# Patient Record
Sex: Female | Born: 1960 | Race: Black or African American | Hispanic: No | Marital: Married | Smoking: Never smoker
Health system: Southern US, Community
[De-identification: ages and names within clinical notes are randomized; demographics above are authoritative.]

## PROBLEM LIST (undated history)

## (undated) DIAGNOSIS — C801 Malignant (primary) neoplasm, unspecified: Secondary | ICD-10-CM

## (undated) DIAGNOSIS — I10 Essential (primary) hypertension: Secondary | ICD-10-CM

## (undated) DIAGNOSIS — Z803 Family history of malignant neoplasm of breast: Secondary | ICD-10-CM

## (undated) DIAGNOSIS — E119 Type 2 diabetes mellitus without complications: Secondary | ICD-10-CM

## (undated) HISTORY — DX: Malignant (primary) neoplasm, unspecified: C80.1

## (undated) HISTORY — DX: Family history of malignant neoplasm of breast: Z80.3

## (undated) HISTORY — DX: Essential (primary) hypertension: I10

## (undated) HISTORY — DX: Type 2 diabetes mellitus without complications: E11.9

---

## 1994-07-30 HISTORY — PX: OTHER SURGICAL HISTORY: SHX169

## 2009-12-22 ENCOUNTER — Ambulatory Visit: Payer: Self-pay | Admitting: Unknown Physician Specialty

## 2013-02-02 ENCOUNTER — Emergency Department: Payer: Self-pay | Admitting: Emergency Medicine

## 2013-02-19 ENCOUNTER — Ambulatory Visit: Payer: Self-pay | Admitting: Unknown Physician Specialty

## 2013-02-20 ENCOUNTER — Ambulatory Visit: Payer: Self-pay | Admitting: Internal Medicine

## 2013-02-20 LAB — CBC CANCER CENTER
Basophil #: 0 x10 3/mm (ref 0.0–0.1)
Basophil %: 0.5 %
Eosinophil #: 0.1 x10 3/mm (ref 0.0–0.7)
Eosinophil %: 1.4 %
HCT: 34.1 % — ABNORMAL LOW (ref 35.0–47.0)
Lymphocyte %: 28.9 %
MCH: 33.4 pg (ref 26.0–34.0)
Monocyte %: 5.7 %
Neutrophil #: 3.8 x10 3/mm (ref 1.4–6.5)
Neutrophil %: 63.5 %
RDW: 13.5 % (ref 11.5–14.5)
WBC: 5.9 x10 3/mm (ref 3.6–11.0)

## 2013-02-20 LAB — COMPREHENSIVE METABOLIC PANEL
Albumin: 3.3 g/dL — ABNORMAL LOW (ref 3.4–5.0)
Anion Gap: 2 — ABNORMAL LOW (ref 7–16)
Bilirubin,Total: 0.3 mg/dL (ref 0.2–1.0)
Calcium, Total: 9 mg/dL (ref 8.5–10.1)
Co2: 32 mmol/L (ref 21–32)
Glucose: 147 mg/dL — ABNORMAL HIGH (ref 65–99)
Osmolality: 278 (ref 275–301)
Potassium: 4.4 mmol/L (ref 3.5–5.1)
SGPT (ALT): 35 U/L (ref 12–78)
Sodium: 137 mmol/L (ref 136–145)

## 2013-02-24 LAB — PROT IMMUNOELECTROPHORES(ARMC)

## 2013-02-24 LAB — KAPPA/LAMBDA FREE LIGHT CHAINS (ARMC)

## 2013-02-27 ENCOUNTER — Ambulatory Visit: Payer: Self-pay | Admitting: Internal Medicine

## 2013-03-18 LAB — CBC CANCER CENTER
Eosinophil: 1 %
HGB: 12.2 g/dL (ref 12.0–16.0)
Lymphocytes: 39 %
MCH: 32.8 pg (ref 26.0–34.0)
MCHC: 35 g/dL (ref 32.0–36.0)
MCV: 94 fL (ref 80–100)
Monocytes: 6 %
Other Cells Blood: 1 %
Platelet: 308 x10 3/mm (ref 150–440)
RBC: 3.7 10*6/uL — ABNORMAL LOW (ref 3.80–5.20)
WBC: 5.7 x10 3/mm (ref 3.6–11.0)

## 2013-03-18 LAB — RETICULOCYTES: Reticulocyte: 2.81 %

## 2013-03-30 ENCOUNTER — Ambulatory Visit: Payer: Self-pay | Admitting: Internal Medicine

## 2013-04-09 LAB — CBC CANCER CENTER
Basophil #: 0 x10 3/mm (ref 0.0–0.1)
Basophil %: 0.5 %
Eosinophil #: 0.2 x10 3/mm (ref 0.0–0.7)
Eosinophil %: 2.7 %
HGB: 12.2 g/dL (ref 12.0–16.0)
Lymphocyte #: 3.2 x10 3/mm (ref 1.0–3.6)
Lymphocyte %: 43.2 %
MCHC: 34.2 g/dL (ref 32.0–36.0)
MCV: 95 fL (ref 80–100)
Monocyte #: 0.5 x10 3/mm (ref 0.2–0.9)
Monocyte %: 6.2 %
Neutrophil %: 47.4 %
Platelet: 371 x10 3/mm (ref 150–440)
RBC: 3.76 10*6/uL — ABNORMAL LOW (ref 3.80–5.20)
WBC: 7.5 x10 3/mm (ref 3.6–11.0)

## 2013-04-09 LAB — CALCIUM: Calcium, Total: 10.2 mg/dL — ABNORMAL HIGH (ref 8.5–10.1)

## 2013-04-09 LAB — CREATININE, SERUM: EGFR (Non-African Amer.): 54 — ABNORMAL LOW

## 2013-04-09 LAB — URIC ACID: Uric Acid: 5.2 mg/dL (ref 2.6–6.0)

## 2013-04-13 LAB — PROT IMMUNOELECTROPHORES(ARMC)

## 2013-04-15 LAB — CREATININE, SERUM
Creatinine: 1.13 mg/dL (ref 0.60–1.30)
EGFR (Non-African Amer.): 56 — ABNORMAL LOW

## 2013-04-15 LAB — CALCIUM: Calcium, Total: 9.8 mg/dL (ref 8.5–10.1)

## 2013-04-20 LAB — CBC CANCER CENTER
Basophil #: 0.1 x10 3/mm (ref 0.0–0.1)
Eosinophil %: 2.2 %
Lymphocyte %: 36.2 %
MCH: 32.5 pg (ref 26.0–34.0)
MCHC: 34.4 g/dL (ref 32.0–36.0)
MCV: 95 fL (ref 80–100)
Monocyte %: 7.1 %
Neutrophil %: 53.5 %
RDW: 13.9 % (ref 11.5–14.5)

## 2013-04-24 LAB — CBC CANCER CENTER
Basophil %: 0.5 %
Eosinophil %: 4.1 %
HCT: 34.4 % — ABNORMAL LOW (ref 35.0–47.0)
HGB: 11.8 g/dL — ABNORMAL LOW (ref 12.0–16.0)
Lymphocyte #: 1 x10 3/mm (ref 1.0–3.6)
Lymphocyte %: 19 %
MCHC: 34.4 g/dL (ref 32.0–36.0)
Monocyte #: 0.4 x10 3/mm (ref 0.2–0.9)
Monocyte %: 7.3 %
Neutrophil %: 69.1 %
RDW: 13.8 % (ref 11.5–14.5)
WBC: 5.3 x10 3/mm (ref 3.6–11.0)

## 2013-04-24 LAB — BASIC METABOLIC PANEL
Anion Gap: 8 (ref 7–16)
Chloride: 97 mmol/L — ABNORMAL LOW (ref 98–107)
Co2: 29 mmol/L (ref 21–32)
EGFR (Non-African Amer.): 59 — ABNORMAL LOW
Glucose: 148 mg/dL — ABNORMAL HIGH (ref 65–99)
Osmolality: 271 (ref 275–301)
Sodium: 134 mmol/L — ABNORMAL LOW (ref 136–145)

## 2013-04-29 ENCOUNTER — Ambulatory Visit: Payer: Self-pay | Admitting: Internal Medicine

## 2013-05-07 LAB — HCG, QUANTITATIVE, PREGNANCY: Beta Hcg, Quant.: <1 m[IU]/mL — ABNORMAL LOW

## 2013-05-18 ENCOUNTER — Observation Stay: Payer: Self-pay | Admitting: Internal Medicine

## 2013-05-18 LAB — BASIC METABOLIC PANEL
Anion Gap: 6 — ABNORMAL LOW (ref 7–16)
BUN: 19 mg/dL — ABNORMAL HIGH (ref 7–18)
Calcium, Total: 8.2 mg/dL — ABNORMAL LOW (ref 8.5–10.1)
Chloride: 96 mmol/L — ABNORMAL LOW (ref 98–107)
Creatinine: 1.75 mg/dL — ABNORMAL HIGH (ref 0.60–1.30)
EGFR (Non-African Amer.): 33 — ABNORMAL LOW
Glucose: 294 mg/dL — ABNORMAL HIGH (ref 65–99)
Osmolality: 278 (ref 275–301)
Potassium: 4.3 mmol/L (ref 3.5–5.1)

## 2013-05-18 LAB — HEPATIC FUNCTION PANEL A (ARMC)
Alkaline Phosphatase: 89 U/L (ref 50–136)
SGOT(AST): 12 U/L — ABNORMAL LOW (ref 15–37)
SGPT (ALT): 19 U/L (ref 12–78)
Total Protein: 8.6 g/dL — ABNORMAL HIGH (ref 6.4–8.2)

## 2013-05-18 LAB — URIC ACID: Uric Acid: 5.2 mg/dL (ref 2.6–6.0)

## 2013-05-18 LAB — PREGNANCY, URINE: Pregnancy Test, Urine: NEGATIVE m[IU]/mL

## 2013-05-19 LAB — BASIC METABOLIC PANEL
Anion Gap: 4 — ABNORMAL LOW (ref 7–16)
BUN: 15 mg/dL (ref 7–18)
Calcium, Total: 7.9 mg/dL — ABNORMAL LOW (ref 8.5–10.1)
Chloride: 106 mmol/L (ref 98–107)
Co2: 27 mmol/L (ref 21–32)
EGFR (African American): >60
EGFR (Non-African Amer.): >60
Glucose: 191 mg/dL — ABNORMAL HIGH (ref 65–99)
Osmolality: 280 (ref 275–301)
Potassium: 3.9 mmol/L (ref 3.5–5.1)

## 2013-05-19 LAB — CBC WITH DIFFERENTIAL/PLATELET
Basophil #: 0 10*3/uL (ref 0.0–0.1)
Basophil %: 0.3 %
Eosinophil %: 2.3 %
HCT: 31.5 % — ABNORMAL LOW (ref 35.0–47.0)
HGB: 10.9 g/dL — ABNORMAL LOW (ref 12.0–16.0)
MCHC: 34.5 g/dL (ref 32.0–36.0)
Monocyte #: 0.4 x10 3/mm (ref 0.2–0.9)
Monocyte %: 9.7 %
Neutrophil #: 3.1 10*3/uL (ref 1.4–6.5)
Neutrophil %: 69.5 %
Platelet: 274 10*3/uL (ref 150–440)
RBC: 3.32 10*6/uL — ABNORMAL LOW (ref 3.80–5.20)
WBC: 4.5 10*3/uL (ref 3.6–11.0)

## 2013-05-19 LAB — URIC ACID: Uric Acid: 3.8 mg/dL (ref 2.6–6.0)

## 2013-05-19 LAB — CREATININE, SERUM: Creatinine: 0.83 mg/dL (ref 0.60–1.30)

## 2013-05-19 LAB — POTASSIUM: Potassium: 3.5 mmol/L (ref 3.5–5.1)

## 2013-05-21 LAB — PROT IMMUNOELECTROPHORES(ARMC)

## 2013-05-25 LAB — PREGNANCY, URINE: Pregnancy Test, Urine: NEGATIVE m[IU]/mL

## 2013-05-30 ENCOUNTER — Ambulatory Visit: Payer: Self-pay | Admitting: Internal Medicine

## 2013-06-05 LAB — PREGNANCY, URINE: Pregnancy Test, Urine: NEGATIVE m[IU]/mL

## 2013-06-29 ENCOUNTER — Ambulatory Visit: Payer: Self-pay | Admitting: Internal Medicine

## 2013-07-30 ENCOUNTER — Ambulatory Visit: Payer: Self-pay | Admitting: Internal Medicine

## 2013-07-30 HISTORY — PX: LIMBAL STEM CELL TRANSPLANT: SHX1969

## 2013-08-26 DIAGNOSIS — C9000 Multiple myeloma not having achieved remission: Secondary | ICD-10-CM | POA: Insufficient documentation

## 2013-08-30 ENCOUNTER — Ambulatory Visit: Payer: Self-pay | Admitting: Internal Medicine

## 2013-09-14 ENCOUNTER — Ambulatory Visit: Payer: Self-pay | Admitting: Gastroenterology

## 2013-09-15 LAB — PATHOLOGY REPORT

## 2013-09-27 ENCOUNTER — Ambulatory Visit: Payer: Self-pay | Admitting: Internal Medicine

## 2013-10-28 ENCOUNTER — Ambulatory Visit: Payer: Self-pay | Admitting: Internal Medicine

## 2013-11-25 DIAGNOSIS — E119 Type 2 diabetes mellitus without complications: Secondary | ICD-10-CM | POA: Insufficient documentation

## 2013-11-27 ENCOUNTER — Ambulatory Visit: Payer: Self-pay | Admitting: Internal Medicine

## 2013-12-09 DIAGNOSIS — I10 Essential (primary) hypertension: Secondary | ICD-10-CM | POA: Insufficient documentation

## 2014-01-11 ENCOUNTER — Ambulatory Visit: Payer: Self-pay | Admitting: Internal Medicine

## 2014-01-11 LAB — POTASSIUM: Potassium: 4.2 mmol/L (ref 3.5–5.1)

## 2014-01-27 ENCOUNTER — Ambulatory Visit: Payer: Self-pay | Admitting: Internal Medicine

## 2014-03-02 ENCOUNTER — Ambulatory Visit: Payer: Self-pay | Admitting: Internal Medicine

## 2014-04-01 ENCOUNTER — Ambulatory Visit: Payer: Self-pay | Admitting: Internal Medicine

## 2014-04-18 DIAGNOSIS — E890 Postprocedural hypothyroidism: Secondary | ICD-10-CM | POA: Insufficient documentation

## 2014-04-19 DIAGNOSIS — J309 Allergic rhinitis, unspecified: Secondary | ICD-10-CM | POA: Insufficient documentation

## 2014-04-29 ENCOUNTER — Ambulatory Visit: Payer: Self-pay | Admitting: Internal Medicine

## 2014-05-30 ENCOUNTER — Ambulatory Visit: Payer: Self-pay | Admitting: Internal Medicine

## 2014-06-29 ENCOUNTER — Ambulatory Visit: Payer: Self-pay | Admitting: Internal Medicine

## 2014-07-30 ENCOUNTER — Ambulatory Visit: Payer: Self-pay | Admitting: Internal Medicine

## 2014-08-30 ENCOUNTER — Ambulatory Visit: Payer: Self-pay | Admitting: Internal Medicine

## 2014-09-28 ENCOUNTER — Ambulatory Visit
Admit: 2014-09-28 | Disposition: A | Discharge status: Home / Self Care | Payer: Self-pay | Attending: Internal Medicine | Admitting: Internal Medicine

## 2014-10-29 ENCOUNTER — Ambulatory Visit
Admit: 2014-10-29 | Disposition: A | Discharge status: Home / Self Care | Payer: Self-pay | Attending: Internal Medicine | Admitting: Internal Medicine

## 2014-11-19 NOTE — H&P (Signed)
PATIENT NAME:  Annette Jimenez, Annette Jimenez MR#:  [id number] DATE OF BIRTH:  1960/11/03  DATE OF ADMISSION:  05/18/2013  Ms. Monrroy is a 54 year old patient who was brought into the hospital for observation for intravenous fluid support and monitoring of electrolytes with onset of acute renal failure, creatinine 1.75, whereas a baseline of 1.03 four days earlier. The pertinent history is that the patient has multiple myeloma, IgG myeloma, prior diagnosed by bone marrow. Complications have included compression fracture of the lumbar spine, prior treated with initially palliative radiation treatment. No other sites of known bone disease. Baseline lab studies have been otherwise unremarkable. Prior liver function tests normal as stated. Creatinine was normal. Uric acid was baseline, normal. Most recent baseline creatinine was 1.03. Baseline hemoglobin is unremarkable at 11.3, platelets normal at 313. The calcium is normal. Liver chemistries are normal, slightly high total protein.   PAST MEDICAL HISTORY: Hypertension and also hyperthyroid, radioactive iodine in the distant past, ectopic pregnancy.   SOCIAL HISTORY: Negative for alcohol or tobacco.   FAMILY HISTORY: Positive for breast cancer, including a sister at a young age.   HOME MEDICATIONS: Decadron 20 mg b.i.d. once a week planned for treatment, hydrochlorothiazide 25 mg p.o. at bedtime, Tylenol p.r.n., recently an aspirin 81 mg daily and planned Valtrex 500 b.i.d. for prophylaxis but not yet started.   CURRENT ILLNESS AND SYSTEM REVIEW: On this background, the patient came to the office for routine lab check after starting chemotherapy day 1 of cycle 1 of Velcade/Decadron 96 hours ago. Has not yet started Revlimid. Labs showed her creatinine had jumped to 1.75 in the absence of any symptoms.   SYSTEMS REVIEW:  The patient has no headache, dizziness, chills, sweats. No coughing, wheezing. No chest or back pain. No palpitations. No retrosternal chest pain.  No ear or jaw pain. No stomatitis or thrush. No change in bowel habits. No change in bladder habits. No dysuria or hematuria. No new sites of bone pain. No edema. No focal weakness or numbness or tingling of the extremities, except slight tingling in the fingers developed after Velcade. No weakness. No gait disturbances. No rash. No hot or cold intolerance.   PHYSICAL EXAMINATION: GENERAL: Alert and cooperative, in no acute distress.  HEENT: No thrush.  LYMPH: No palpable lymph nodes in the neck, supraclavicular, submandibular, axillae.   LUNGS: Clear.  ABDOMEN: Nontender.  HEART: Regular.  EXTREMITIES: No edema.  NEUROLOGIC: Grossly nonfocal.   LABORATORY DATA: Today, the glucose was high at 294, BUN 19, creatinine 1.75, sodium 132, potassium 4.3, calcium 8.2. Uric acid was 5.2. Bilirubin direct was  elevated at 0.4. The rest of the liver functions are normal. The total protein is 8.6.   IMPRESSION: This patient with multiple myeloma has just started chemotherapy. We plan for Revlimid, Decadron and Velcade. Later bone marrow transplant. Has not yet started Revlimid. Had 1 dose of Velcade at 1.5 mg/sq m on October 17. Baseline labs unremarkable. The creatinine has jumped up in the absence of any symptoms or any fever. There is no obvious  tumor lysis. Potassium is normal. The uric acid is normal. The patient has had adequate oral intake. She has had no diarrhea. This may be early tumor lysis. It may be progression of myeloma or accelerating of the M components, although the total protein is not higher. This could be dehydration. Unlikely to be a Velcade effect. Unlikely to be an effect of nonsteroidal medicines. Just started 81 mg of aspirin a few days ago. Glucose  is high. Might reflect that this is not a fasting sample. Also, the patient did have Decadron with the Velcade on October 17. Blood pressure is steady.   PLAN: IV fluids. Hold the thiazide. Hold the 81 mg of aspirin. Would also hold on,  and note have not yet started, but do not start on Valtrex at the current time. Recheck labs in the morning. If creatinine does not improve, get a renal ultrasound. Watch also the glucose. May need sliding scale coverage. Also if creatinine does not improve, would document a repeat electrophoresis and might look at giving the next dose of Velcade earlier than the weekly planned.   ____________________________ Simonne Come. Inez Pilgrim, MD [id number] D: 05/18/2013 20:06:25 ET T: 05/18/2013 20:48:59 ET JOB#: [id number]  cc: Simonne Come. Inez Pilgrim, MD, <Dictator> Dallas Schimke MD ELECTRONICALLY SIGNED 06/17/2013 12:27

## 2014-11-19 NOTE — Consult Note (Signed)
Reason for Visit: This 54 year old Female patient presents to the clinic for initial evaluation of  multiple was at L2 involvement .   Referred by Dr. Cynda Acres.  Diagnosis:  Chief Complaint/Diagnosis   55 year old female with compression fracture of L2 secondary to multiple myeloma  Pathology Report pathology report pending   Imaging Report lumbar MRI and plain films reviewed   Referral Report clinical notes reviewed   Planned Treatment Regimen palliative radiation therapy to L2   HPI   patient is a 54 year old female who is present with low back pain since 2010. She's had no change in sensory or motor loss in the lower extremities. She had an MRI scan of her lumbar spine showing retropulsion of bone and compression fracture and marrow abnormality consistent with possible malignancy.bone marrow was positive for multiple myeloma. She also has a significant IgG spike.She has mild anemia and iron deficiency anemia. Dr. Cynda Acres has requested my review for possible palliation to the L2 vertebral body. She seen today in very little pain at this time. Describes no other areas of bony pain.  Past Hx:    ectopic pregnancy:   Past, Family and Social History:  Past Medical History positive   Endocrine hyperthyroidism; history of radioiodine treatment multiple years prior   Past Surgical History history of ectopic pregnancy   Family History positive   Family History Comments family history of breast cancer mother age 32 and sister age 57   Social History noncontributory   Additional Past Medical and Surgical History seen by herself today   Allergies:   No Known Allergies:   Home Meds:  Home Medications: Medication Instructions Status  hydrochlorothiazide 25 mg oral tablet 1 tab(s) orally once a day Active  acetaminophen-HYDROcodone 325 mg-5 mg oral tablet 1 to 2 tab(s) orally every 6 hours Active  Tylenol 500 mg oral tablet 2 tab(s) orally every 6 hours, As Needed - for Pain Active    Review of Systems:  General negative   Performance Status (ECOG) 0   Skin negative   Breast negative   Ophthalmologic negative   ENMT negative   Respiratory and Thorax negative   Cardiovascular negative   Gastrointestinal negative   Genitourinary negative   Musculoskeletal negative   Neurological negative   Psychiatric negative   Hematology/Lymphatics negative   Endocrine negative   Allergic/Immunologic negative   Nursing Notes:  Nursing Vital Signs and Chemo Nursing Nursing Notes: *CC Vital Signs Flowsheet:   04-Sep-14 08:25  Temp Temperature 97.9  Pulse Pulse 91  Respirations Respirations 20  SBP SBP 138  DBP DBP 79  Pain Scale (0-10)  3  Current Weight (kg) (kg) 86.9  Height (cm) centimeters 159  BSA (m2) 1.8   Physical Exam:  General/Skin/HEENT:  General normal   Skin normal   Eyes normal   ENMT normal   Head and Neck normal   Additional PE well-developed slightly obese female in NAD. No cervical or supraclavicular adenopathy is appreciated lungs are clear to A&P cardiac examination shows regular rate and rhythm. Abdomen is benign. Motor sensory and DTR levels are equal and symmetric in the lower extremities bilaterally. Proprioception is intact. No sensory level is appreciated.no pain is elicited on deep palpation of her lower back. Range of motion of her lower extremities does not elicit pain.   Breasts/Resp/CV/GI/GU:  Respiratory and Thorax normal   Cardiovascular normal   Gastrointestinal normal   Genitourinary normal   MS/Neuro/Psych/Lymph:  Musculoskeletal normal   Neurological normal  Lymphatics normal   Other Results:  Radiology Results: [place]:    31-Jul-14 14:43, Bone Survey Limited Dx  Bone Survey Limited Dx   REASON FOR EXAM:    metastatic myeloma, spinal lesions  COMMENTS:       PROCEDURE: DXR - DXR BONE SURVEY METASTATIC  - Feb 26 2013  2:43PM     RESULT: The cardiac silhouette is normal. The lungs appear  clear.   Cervical spine alignment is maintained with minimal degenerative change   at the C6-C7 and C7-T1 levels. No lytic or sclerotic calvarial mass is   evident. The included ribs, cervical, thoracic and lumbar spine, pelvis   and long bones of the upper and lower extremities show no definite lytic   or sclerotic mass. There does appear to be some sclerosis in the L2   vertebral body with a heterogeneous pattern of attenuation which could be   from compression changes noted on the MRI. The etiology for that is   uncertain.  IMPRESSION:  Abnormal appearance in the L2 vertebral body as noted on the   previous MRI. The bone survey is otherwise unremarkable.    Dictation Site: 2        Verified By: Sundra Aland, M.D., MD  LabUnknown:    24-Jul-14 08:56, MRI Lumbar Spine Without Contrast  PACS Image     31-Jul-14 14:43, Bone Survey Limited Dx  PACS Image   MRI:    24-Jul-14 08:56, MRI Lumbar Spine Without Contrast  MRI Lumbar Spine Without Contrast   REASON FOR EXAM:    lower back pain Stat Read fax results to [id number]  COMMENTS:       PROCEDURE: MMR - MMR LUMBAR SPINE WO CONTRAST  - Feb 19 2013  8:56AM     RESULT:  Sagittal and axial T1 and T2 weighted images were obtained   through the lumbarspine.    There is partial compression of the body of L2. There is mild   retropulsion of bone. The loss of height is no more than 10% anteriorly   and posteriorly. There is marrow edema. The conus medullaris terminates   posterior to the upper aspect of L2. Axial imaging was performed from the   upper aspect of T12 to the upper aspect of S1.    At the T12-L1 level the AP dimension of the CSF filled thecal sac     measures 15 mm and the neural foramina are patent.    At L1-L2 the AP dimension of the CSF filled thecal sac is 14 mm with   patency of the neural foramina.    There is retropulsion of bone to the right of midline along the posterior   aspect of the body of L2.  This does produce mild mass effect upon the   thecal sac. However, the minimal AP dimension of the thecal sac is 10 mm.    At the L2-L3 disc level the AP dimension of the thecal sac measures 13 mm   and the neural foramina are patent. Just above the disc level   retropulsion of bone does produce mild mass effect upon the exiting L2   nerve root on the right.    At L3-L4 there is minimal annular disc bulging. In the midline the AP     dimension of the thecal sac is 14 mm. The right neural foramen is patent.   On the left there is mild encroachment upon the neural foramen.    At L4-L5 there  is annular disc bulging. In the midline the AP dimension   of the thecal sac is 11 mm. There is mild encroachment upon the neural   foramina bilaterally. Mild facet joint hypertrophy is present here.    At L5-S1 there is mild annular disc bulging. In the midline the AP   dimension of the thecal sac is 10 mm. No more than mild encroachment upon   the neural foramina is demonstrated.    IMPRESSION:    1. There is abnormal marrow signal, mild retropulsion of bone, and very   mild loss of height of L2. The findings are consistent with partial   compression. This may reflect a pathologic fracture rather than be     related to osteopenia. There is no high-grade neural foraminal   encroachment nor high-grade AP dimensional spinal stenosis. The minimal   AP dimension of the thecal sac at this level is 10 mm.  2. At L3-L4, L4-L5, L5-S1 there is mild annular disc bulging with the AP   dimension of the thecal sac measuring 14 mm, 11 mm, and 10 mm   respectively. Mild encroachment upon the neural foramina is demonstrated.    The patient is already under the care of Dr. Buddy Duty no. It may be useful to   consider the patient for a nuclear bone scan to assess other areas of the   skeleton for possible involvement by malignancy.    Dictation Site: 1      Addendum: In the last paragraph above the statement  should read the     patient is already under the care of Dr. Karolee Ohs. The statement "Dr.   Buddy Duty no" is a typographical error.        Verified By: DAVID A. Martinique, M.D., MD   Relevent Results:   Relevant Scans and Labs bone survey and MRI scans reviewed   Assessment and Plan: Impression:   myelomas involvement of the L2 vertebral body causing compression fracture in patient with known bone marrow biopsy positive for IgG multiple myeloma Plan:   at the stomach ahead with palliative course of radiation therapy to her lumbar spine. Would plan on delivering 3000 cGy in 10 fractions. Risks and benefits of treatment including possibility of diarrhea, skin reaction, alteration of blood counts all were explained in detail to the patient. She seems to comprehend my treatment plan well. I have set her up for CT simulation early next week. Patient will also be seeing Dr. Cynda Acres for further recommendations as far as systemic treatment.  I would like to take this opportunity to thank you for allowing me to continue to participate in this patient's care.  CC Referral:  cc: Dr. Ezequiel Kayser   Electronic Signatures: Baruch Gouty, Roda Shutters (MD)  (Signed 04-Sep-14 11:54)  Authored: HPI, Diagnosis, Past Hx, PFSH, Allergies, Home Meds, ROS, Nursing Notes, Physical Exam, Other Results, Relevent Results, Encounter Assessment and Plan, CC Referring Physician   Last Updated: 04-Sep-14 11:54 by Armstead Peaks (MD)

## 2014-12-01 ENCOUNTER — Encounter: Payer: Self-pay | Admitting: Internal Medicine

## 2014-12-03 ENCOUNTER — Inpatient Hospital Stay: Payer: 59 | Attending: Internal Medicine

## 2014-12-03 VITALS — BP 132/90 | HR 88 | Temp 96.8°F | Resp 20 | Ht 62.6 in | Wt 185.0 lb

## 2014-12-03 DIAGNOSIS — C9000 Multiple myeloma not having achieved remission: Secondary | ICD-10-CM | POA: Insufficient documentation

## 2014-12-03 DIAGNOSIS — Z9484 Stem cells transplant status: Secondary | ICD-10-CM | POA: Insufficient documentation

## 2014-12-03 DIAGNOSIS — Z803 Family history of malignant neoplasm of breast: Secondary | ICD-10-CM | POA: Diagnosis not present

## 2014-12-03 DIAGNOSIS — C9002 Multiple myeloma in relapse: Secondary | ICD-10-CM

## 2014-12-03 DIAGNOSIS — D509 Iron deficiency anemia, unspecified: Secondary | ICD-10-CM | POA: Diagnosis not present

## 2014-12-03 DIAGNOSIS — Z5111 Encounter for antineoplastic chemotherapy: Secondary | ICD-10-CM | POA: Diagnosis not present

## 2014-12-03 DIAGNOSIS — Z79899 Other long term (current) drug therapy: Secondary | ICD-10-CM | POA: Diagnosis not present

## 2014-12-03 MED ORDER — ZOLEDRONIC ACID 4 MG/5ML IV CONC
4.0000 mg | Freq: Once | INTRAVENOUS | Status: AC
  Administered 2014-12-03: 4 mg via INTRAVENOUS
  Filled 2014-12-03: qty 5

## 2014-12-03 MED ORDER — BORTEZOMIB CHEMO SQ INJECTION 3.5 MG (2.5MG/ML)
2.4000 mg | Freq: Once | INTRAMUSCULAR | Status: AC
  Administered 2014-12-03: 2.5 mg via SUBCUTANEOUS
  Filled 2014-12-03: qty 2.5

## 2014-12-03 MED ORDER — ZOLEDRONIC ACID 4 MG/100ML IV SOLN: 4.0000 mg | Freq: Once | INTRAVENOUS | Status: DC

## 2014-12-03 MED ORDER — SODIUM CHLORIDE 0.9 % IV SOLN
INTRAVENOUS | Status: DC
  Administered 2014-12-03: 15:00:00 via INTRAVENOUS
  Filled 2014-12-03: qty 250

## 2014-12-03 MED ORDER — ONDANSETRON HCL 4 MG PO TABS
4.0000 mg | ORAL_TABLET | Freq: Once | ORAL | Status: AC
  Administered 2014-12-03: 4 mg via ORAL
  Filled 2014-12-03: qty 1

## 2014-12-15 ENCOUNTER — Encounter: Payer: Self-pay | Admitting: Internal Medicine

## 2014-12-17 ENCOUNTER — Encounter: Payer: Self-pay | Admitting: Internal Medicine

## 2014-12-17 ENCOUNTER — Encounter (INDEPENDENT_AMBULATORY_CARE_PROVIDER_SITE_OTHER): Payer: Self-pay

## 2014-12-17 ENCOUNTER — Inpatient Hospital Stay: Payer: 59

## 2014-12-17 ENCOUNTER — Inpatient Hospital Stay (HOSPITAL_BASED_OUTPATIENT_CLINIC_OR_DEPARTMENT_OTHER): Payer: 59 | Admitting: Hematology and Oncology

## 2014-12-17 VITALS — BP 148/93 | HR 86 | Temp 98.0°F | Resp 18 | Ht 61.0 in | Wt 189.6 lb

## 2014-12-17 DIAGNOSIS — Z79899 Other long term (current) drug therapy: Secondary | ICD-10-CM

## 2014-12-17 DIAGNOSIS — C9000 Multiple myeloma not having achieved remission: Secondary | ICD-10-CM | POA: Diagnosis not present

## 2014-12-17 DIAGNOSIS — D509 Iron deficiency anemia, unspecified: Secondary | ICD-10-CM

## 2014-12-17 DIAGNOSIS — Z9484 Stem cells transplant status: Secondary | ICD-10-CM

## 2014-12-17 DIAGNOSIS — Z803 Family history of malignant neoplasm of breast: Secondary | ICD-10-CM

## 2014-12-17 DIAGNOSIS — C9002 Multiple myeloma in relapse: Secondary | ICD-10-CM

## 2014-12-17 DIAGNOSIS — Z5111 Encounter for antineoplastic chemotherapy: Secondary | ICD-10-CM | POA: Diagnosis not present

## 2014-12-17 MED ORDER — BORTEZOMIB CHEMO SQ INJECTION 3.5 MG (2.5MG/ML)
1.3000 mg/m2 | Freq: Once | INTRAMUSCULAR | Status: AC
  Administered 2014-12-17: 2.5 mg via SUBCUTANEOUS
  Filled 2014-12-17: qty 1

## 2014-12-17 MED ORDER — ONDANSETRON HCL 4 MG PO TABS
4.0000 mg | ORAL_TABLET | Freq: Once | ORAL | Status: AC
  Administered 2014-12-17: 4 mg via ORAL
  Filled 2014-12-17: qty 1

## 2014-12-18 ENCOUNTER — Encounter: Payer: Self-pay | Admitting: Hematology and Oncology

## 2014-12-18 NOTE — Progress Notes (Signed)
[facility] day:  12/17/2014  Chief Complaint: Annette Jimenez is an 54 y.o. female with multiple myeloma status post autologous stem cell transplant who is seen for assessment prior to every other week Velcade.  HPI: The patient states that she underwent stem cell transplant on 12/08/2013. She has been on maintenance of Velcade every other week since that time. She also receives Zometa monthly. Last cycle of Velcade was on 11/04/2014 and 11/19/2014.  Zometa was given on 11/04/2014. The patient states that her kidney function is checked every other time. It is "all right". Last monoclonal spike was 0. She states that she is due for Zometa "next time".  Labs on 12/15/2014 included a hematocrit 34.3, hemoglobin 11.4, MCV 94, platelets 236,000, white count 5000 with an ANC of 2600.  Past Medical History  Diagnosis Date  . Cancer     Multiple Myeloma    Past Surgical History  Procedure Laterality Date  . Tubal ligation Left     Pt believes in was 1996    Family History  Problem Relation Age of Onset  . Cancer Mother   . Hypertension Mother   . Cancer Sister   . Diabetes Sister   . Crohn's disease Sister     Social History:  reports that she has never smoked. She does not have any smokeless tobacco history on file. She reports that she does not drink alcohol or use illicit drugs.  The patient is alone today  Allergies: Not on File  Current Medications: Current Outpatient Prescriptions  Medication Sig Dispense Refill  . amLODipine (NORVASC) 5 MG tablet Take 5 mg by mouth daily.    . valACYclovir (VALTREX) 500 MG tablet Take 500 mg by mouth daily.     No current facility-administered medications for this visit.   Review of Systems:  GENERAL:  Feels fine.  Active.  No fevers, sweats or weight loss. PERFORMANCE STATUS (ECOG):  1 HEENT:  No visual changes, runny nose, sore throat, mouth sores or tenderness. Lungs: No shortness of  breath or cough.  No hemoptysis. Cardiac:  No chest pain, palpitations, orthopnea, or PND. GI:  No nausea, vomiting, diarrhea, constipation, melena or hematochezia. GU:  No urgency, frequency, dysuria, or hematuria. Musculoskeletal:  No back pain.  No joint pain.  No muscle tenderness. Extremities:  No pain or swelling. Skin:  No rashes or skin changes. Neuro:  No headache, numbness or weakness, balance or coordination issues. Endocrine:  No diabetes, thyroid issues, hot flashes or night sweats. Psych:  No mood changes, depression or anxiety. Pain:  No focal pain. Review of systems:  All other systems reviewed and found to be negative.  Physical Exam: Blood pressure 148/93, pulse 86, temperature 98 F (36.7 C), temperature source Oral, resp. rate 18, height '5\' 1"'  (1.549 m), weight 189 lb 9.5 oz (86 kg). GENERAL:  Well developed, well nourished, sitting comfortably in the exam room in no acute distress. MENTAL STATUS:  Alert and oriented to person, place and time. HEAD: Wearing a cap.  Normocephalic, atraumatic, face symmetric, no Cushingoid features. EYES:  Brown eyes.  Pupils equal round and reactive to light and accomodation.  No conjunctivitis or scleral icterus. ENT:  Oropharynx clear without lesion.  Tongue normal. Mucous membranes moist.  RESPIRATORY:  Clear to auscultation without rales, wheezes or rhonchi. CARDIOVASCULAR:  Regular rate and rhythm without murmur, rub or gallop. ABDOMEN:  Soft, non-tender, with active bowel sounds, and no hepatosplenomegaly.  No  masses. SKIN:  No rashes, ulcers or lesions. EXTREMITIES: No edema, no skin discoloration or tenderness.  No palpable cords. LYMPH NODES: No palpable cervical, supraclavicular, axillary or inguinal adenopathy  NEUROLOGICAL: Unremarkable. PSYCH:  Appropriate.  Assessment:  ARMA REINING is an 55 y.o. female with MYELOMA, BM PROVEN, ,initially  1.9G IGG SPIKE , WITH INVOLVEMENT L2, PATHOLOGIC FX, SPIKE up to 2.8  04/09/13, with total IGG  4700,,   2. MILD ANEMIA, MALIGNANCY PLUS IRON DEF, BM ASP PROVEN, attribute to menstrual losses    3. FH POS FOR BREAST CANCER  07/2013 HAD SCREENING MAMMOGRAM   4.. 09/13/13 HAD age appropriate colonoscopy  5 Completed xrt L spine  7. S/p   zometa  03/2013 TO 09/2013 8. [facility] TRANSPLANT DONE 12/09/13.  Clinically, she is doing well.  She denies any complaint.  Exam is unremarkable.  Plan: 1. Review CBC from [facility]. 2. Chemotherapy orders written.  Velcade today. 3. Complete [facility] slips for next 2 visits.  Labs at next check (CBC with diff, CMP, SPEP). 4. RTC in 2 weeks for MD assessment , review of labs, Velcade and Zometa.  Lequita Asal, MD  12/17/2014, 5:49 PM

## 2014-12-31 ENCOUNTER — Inpatient Hospital Stay: Payer: 59

## 2014-12-31 ENCOUNTER — Inpatient Hospital Stay: Payer: 59 | Attending: Family Medicine | Admitting: Hematology and Oncology

## 2014-12-31 ENCOUNTER — Ambulatory Visit: Payer: 59

## 2014-12-31 ENCOUNTER — Ambulatory Visit: Payer: 59 | Admitting: Hematology and Oncology

## 2014-12-31 VITALS — BP 130/86 | HR 96 | Temp 98.7°F | Wt 189.6 lb

## 2014-12-31 DIAGNOSIS — Z5111 Encounter for antineoplastic chemotherapy: Secondary | ICD-10-CM | POA: Insufficient documentation

## 2014-12-31 DIAGNOSIS — Z923 Personal history of irradiation: Secondary | ICD-10-CM

## 2014-12-31 DIAGNOSIS — C9002 Multiple myeloma in relapse: Secondary | ICD-10-CM

## 2014-12-31 DIAGNOSIS — Z79899 Other long term (current) drug therapy: Secondary | ICD-10-CM | POA: Diagnosis not present

## 2014-12-31 DIAGNOSIS — Z9484 Stem cells transplant status: Secondary | ICD-10-CM | POA: Diagnosis not present

## 2014-12-31 DIAGNOSIS — C9001 Multiple myeloma in remission: Secondary | ICD-10-CM

## 2014-12-31 DIAGNOSIS — Z809 Family history of malignant neoplasm, unspecified: Secondary | ICD-10-CM | POA: Diagnosis not present

## 2014-12-31 MED ORDER — ZOLEDRONIC ACID 4 MG/5ML IV CONC: 4.0000 mg | Freq: Once | INTRAVENOUS | Status: DC

## 2014-12-31 MED ORDER — SODIUM CHLORIDE 0.9 % IV SOLN
Freq: Once | INTRAVENOUS | Status: AC
  Administered 2014-12-31: 15:00:00 via INTRAVENOUS
  Filled 2014-12-31: qty 1000

## 2014-12-31 MED ORDER — BORTEZOMIB CHEMO SQ INJECTION 3.5 MG (2.5MG/ML)
1.3000 mg/m2 | Freq: Once | INTRAMUSCULAR | Status: AC
  Administered 2014-12-31: 2.5 mg via SUBCUTANEOUS
  Filled 2014-12-31: qty 1

## 2014-12-31 MED ORDER — ONDANSETRON HCL 4 MG PO TABS
4.0000 mg | ORAL_TABLET | Freq: Once | ORAL | Status: AC
  Administered 2014-12-31: 4 mg via ORAL
  Filled 2014-12-31: qty 1

## 2014-12-31 MED ORDER — ZOLEDRONIC ACID 4 MG/100ML IV SOLN
4.0000 mg | Freq: Once | INTRAVENOUS | Status: AC
  Administered 2014-12-31: 4 mg via INTRAVENOUS
  Filled 2014-12-31: qty 100

## 2014-12-31 NOTE — Progress Notes (Signed)
[facility] day:  12/31/2014  Chief Complaint: RAYNE COWDREY is an 54 y.o. 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  HPI: The patient was last seen in the medical oncology clinic on 12/17/2014.  At that time, she was seen for initial assessment by me.  She had initially presented with acute low back pain on 02/02/2013. ER evaluation revealed a compression fracture at T2.  In addition, there was loss of the normal lumbar lordosis, multiple T1 hypointense and T2 isointense iso- to hyperintense lesions throughout the visualized spine primarily involving L2, L4 and L5.  There was diffuse replacement of L2 with tumor extending into the right paracentral aspect of the spinal canal with mild spinal canal stenosis.  Labs from 02/20/2013 revealed a hemoglobin 11.9, MCV 96, platelets 3 and 4000. Creatinine was 0.81, calcium 9, and albumen 3.6. LDH was 396.  SPEP revealed 1.9 g/dL IgG lambda monoclonal protein.  Kappa free light chains were 11.3,   Lambda free light chains were 42.68.  Bone survey on 02/26/2013 revealed L2 lytic lesion and C6 inferior endplate subtle concave compression deformity.  Follow up testing on 04/12/2013 revealed a monoclonal spike of 2.8 g/dL. Bone marrow biopsy revealed 11% plasma cells by aspirate and 30% plasma cells by CD138. Cytogenetics were normal. FISH showed FGFR3/IGH translocation t(4;14), monosomy 13, and gain of 1q21.   She received radiation to T2. She was seen by Dr. Evelene Croon at the myeloma clinic at [facility] for second opinion. She began RVD and chemotherapy. She had an excellent response with an unconfirmed CR prior to transplant.  The patient underwent transplant conditioning with melphalan 200 mg/m2 followed by autologous stem cell transplant on 12/10/2013.   Following transplant in 02/2014, she began Velcade every 2 weeks for maintenance  therapy.  She was seen on 12/09/2014 for her 360 day workup at [facility].  Labs included a normal CBC. She was noted to be in complete remission from labs done at her prior visit. She received the first of 3 doses of immunizations and (DTaP-hep B recombinant inactivated polio-PEDIARIX), haemophilus influenza conjugate (ActHIB), and pneumococcal conjugate (Prevnar).  It was recommended that she receive additional doses at 2 and 4 months (14 and 16 months post transplant). She was to be seen in follow-up in one year. She was to continue her every 4 weeks Zometa.  She last received Velcade on 12/17/2014.  She last received Zometa on 12/03/2014.  [facility] labs on 12/29/2014 included a hematocrit 33.8, hemoglobin 11.6, MCV 92, platelets 244,000, white count 5900 with an ANC of 3000.  Comprehensive metabolic panel was normal with a creatinine of 0.76, calcium 9.2, albumen 4.5, protein 7.6. Serum protein electrophoresis is pending.   Symptomatically, she denies any complaint.  Past Medical History  Diagnosis Date  . Cancer     Multiple Myeloma    Past Surgical History  Procedure Laterality Date  . Tubal ligation Left     Pt believes in was 1996    Family History  Problem Relation Age of Onset  . Cancer Mother   . Hypertension Mother   . Cancer Sister   . Diabetes Sister   . Crohn's disease Sister     Social History:  reports that she has never smoked. She does not have any smokeless tobacco history on file. She reports that she does not drink alcohol or use illicit drugs.  The patient  is alone today.  Allergies:  Allergies  Allergen Reactions  . Bee Venom Swelling    Current Medications: Current Outpatient Prescriptions  Medication Sig Dispense Refill  . amLODipine (NORVASC) 5 MG tablet Take 5 mg by mouth daily.    . valACYclovir (VALTREX) 500 MG tablet Take 500 mg by mouth daily.     No current facility-administered medications for this visit.    Review of Systems:  GENERAL:  Feels  "ok".  Active.  No fevers, sweats or weight loss. PERFORMANCE STATUS (ECOG): 1 HEENT:  No visual changes, runny nose, sore throat, mouth sores or tenderness. Lungs: No shortness of breath or cough.  No hemoptysis. Cardiac:  No chest pain, palpitations, orthopnea, or PND. GI:  No nausea, vomiting, diarrhea, constipation, melena or hematochezia. GU:  No urgency, frequency, dysuria, or hematuria. Musculoskeletal:  No back pain.  No joint pain.  No muscle tenderness. Extremities:  No pain or swelling. Skin:  No rashes or skin changes. Neuro:  No headache, numbness or weakness, balance or coordination issues. Endocrine:  No diabetes, thyroid issues, hot flashes or night sweats. Psych:  No mood changes, depression or anxiety. Pain:  No focal pain. Review of systems:  All other systems reviewed and found to be negative.   Physical Exam: Blood pressure 130/86, pulse 96, temperature 98.7 F (37.1 C), temperature source Tympanic, weight 189 lb 9.5 oz (86 kg). GENERAL:  Well developed, well nourished, sitting comfortably in the exam room in no acute distress. MENTAL STATUS:  Alert and oriented to person, place and time. HEAD:  Normocephalic, atraumatic, face symmetric, no Cushingoid features. EYES:  Brown eyes.  Pupils equal round and reactive to light and accomodation.  No conjunctivitis or scleral icterus. ENT:  Oropharynx clear without lesion.  Tongue normal. Mucous membranes moist.  RESPIRATORY:  Clear to auscultation without rales, wheezes or rhonchi. CARDIOVASCULAR:  Regular rate and rhythm without murmur, rub or gallop. ABDOMEN:  Soft, non-tender, with active bowel sounds, and no hepatosplenomegaly.  No masses. SKIN:  No rashes, ulcers or lesions. EXTREMITIES: No edema, no skin discoloration or tenderness.  No palpable cords. LYMPH NODES: No palpable cervical, supraclavicular, axillary or inguinal adenopathy  NEUROLOGICAL: Unremarkable. PSYCH:  Appropriate.    Assessment:  AMREEN RACZKOWSKI is an 54 y.o. female with stage III IgG lambda multiple myeloma.  She presented with acute back pain and compression fracture of T2 on 02/02/2013.  SPEP revealed 1.9 g/dL IgG lambda monoclonal protein.  Kappa free light chains were 11.3 and lambda free light chains were 42.68.  Bone survey on 02/26/2013 revealed L2 lytic lesion and C6 inferior endplate subtle concave compression deformity. Bone marrow biopsy revealed 11% plasma cells by aspirate and 30% plasma cells by CD138. Cytogenetics were normal. FISH showed FGFR3/IGH translocation t(4;14), monosomy 13, and gain of 1q21.   She received radiation to T2.  She received RVD chemotherapy.  She underwent autologous stem cell transplant on 12/10/2013.   Following transplant in 02/2014, she began Velcade every 2 weeks for maintenance therapy.  She receives Zometa monthly (last 12/03/2014).  Symptomatically, she is doing well.  Exam is unremarkable. CBC is normal except for slightly low hematocrit of 33.82. Comprehensive metabolic panel is normal. Serum protein electrophoresis is pending.  Plan: 1. Review [facility] labs. 2. Velcade and Zometa today. 3. Fill out [facility] slips for the next month. 4. RTC in 2 weeks for Velcade (labs at [facility]) 5. RTC in 4 weeks for MD assessment, labs ([facility]), Velcade and Zometa  Lequita Asal, MD  12/31/2014, 2:08 PM

## 2015-01-05 ENCOUNTER — Ambulatory Visit: Payer: 59 | Admitting: Internal Medicine

## 2015-01-05 ENCOUNTER — Ambulatory Visit: Payer: 59

## 2015-01-14 ENCOUNTER — Inpatient Hospital Stay: Payer: 59

## 2015-01-14 VITALS — BP 127/88 | HR 101 | Temp 98.6°F | Resp 18

## 2015-01-14 DIAGNOSIS — C9002 Multiple myeloma in relapse: Secondary | ICD-10-CM

## 2015-01-14 DIAGNOSIS — Z5111 Encounter for antineoplastic chemotherapy: Secondary | ICD-10-CM | POA: Diagnosis not present

## 2015-01-14 MED ORDER — BORTEZOMIB CHEMO SQ INJECTION 3.5 MG (2.5MG/ML)
1.3000 mg/m2 | Freq: Once | INTRAMUSCULAR | Status: DC
  Filled 2015-01-14: qty 1

## 2015-01-14 MED ORDER — ONDANSETRON HCL 4 MG PO TABS
4.0000 mg | ORAL_TABLET | Freq: Once | ORAL | Status: AC
  Administered 2015-01-14: 4 mg via ORAL
  Filled 2015-01-14: qty 1

## 2015-01-14 NOTE — Progress Notes (Unsigned)
Patients labs came from [facility] and no CMP was drawn.  Asked patient if she would like to have additional labs drawn here and patient refused.  States she will "skip" this shot and come back at regular scheduled time.  Patient was offered to come back Mon or Tues after having blood drawn at [facility] but patient refused.

## 2015-01-26 ENCOUNTER — Encounter: Payer: Self-pay | Admitting: Hematology and Oncology

## 2015-01-28 ENCOUNTER — Inpatient Hospital Stay: Payer: 59 | Attending: Hematology and Oncology | Admitting: Hematology and Oncology

## 2015-01-28 ENCOUNTER — Inpatient Hospital Stay: Payer: 59

## 2015-01-28 ENCOUNTER — Encounter: Payer: Self-pay | Admitting: Hematology and Oncology

## 2015-01-28 ENCOUNTER — Other Ambulatory Visit: Payer: 59

## 2015-01-28 VITALS — BP 140/92 | HR 96 | Temp 98.0°F | Wt 216.1 lb

## 2015-01-28 DIAGNOSIS — C9001 Multiple myeloma in remission: Secondary | ICD-10-CM

## 2015-01-28 DIAGNOSIS — Z9484 Stem cells transplant status: Secondary | ICD-10-CM | POA: Diagnosis not present

## 2015-01-28 DIAGNOSIS — C9002 Multiple myeloma in relapse: Secondary | ICD-10-CM

## 2015-01-28 DIAGNOSIS — I10 Essential (primary) hypertension: Secondary | ICD-10-CM | POA: Insufficient documentation

## 2015-01-28 DIAGNOSIS — Z79899 Other long term (current) drug therapy: Secondary | ICD-10-CM | POA: Insufficient documentation

## 2015-01-28 DIAGNOSIS — Z5111 Encounter for antineoplastic chemotherapy: Secondary | ICD-10-CM | POA: Insufficient documentation

## 2015-01-28 DIAGNOSIS — Z923 Personal history of irradiation: Secondary | ICD-10-CM | POA: Insufficient documentation

## 2015-01-28 DIAGNOSIS — E119 Type 2 diabetes mellitus without complications: Secondary | ICD-10-CM | POA: Diagnosis not present

## 2015-01-28 DIAGNOSIS — Z809 Family history of malignant neoplasm, unspecified: Secondary | ICD-10-CM | POA: Diagnosis not present

## 2015-01-28 DIAGNOSIS — R7303 Prediabetes: Secondary | ICD-10-CM | POA: Insufficient documentation

## 2015-01-28 MED ORDER — ONDANSETRON HCL 4 MG PO TABS
4.0000 mg | ORAL_TABLET | Freq: Once | ORAL | Status: AC
  Administered 2015-01-28: 4 mg via ORAL
  Filled 2015-01-28: qty 1

## 2015-01-28 MED ORDER — ZOLEDRONIC ACID 4 MG/5ML IV CONC: 4.0000 mg | Freq: Once | INTRAVENOUS | Status: DC

## 2015-01-28 MED ORDER — SODIUM CHLORIDE 0.9 % IV SOLN
Freq: Once | INTRAVENOUS | Status: AC
  Administered 2015-01-28: 16:00:00 via INTRAVENOUS
  Filled 2015-01-28: qty 1000

## 2015-01-28 MED ORDER — ZOLEDRONIC ACID 4 MG/100ML IV SOLN
4.0000 mg | Freq: Once | INTRAVENOUS | Status: AC
  Administered 2015-01-28: 4 mg via INTRAVENOUS
  Filled 2015-01-28: qty 100

## 2015-01-28 MED ORDER — BORTEZOMIB CHEMO SQ INJECTION 3.5 MG (2.5MG/ML)
1.3000 mg/m2 | Freq: Once | INTRAMUSCULAR | Status: AC
  Administered 2015-01-28: 2.5 mg via SUBCUTANEOUS
  Filled 2015-01-28: qty 2.5

## 2015-01-28 NOTE — Progress Notes (Signed)
[facility] day:  01/28/2015  Chief Complaint: Annette Jimenez is an 54 y.o. female with multiple myeloma status post autologous stem cell transplant who is seen for assessment prior to every other week Velcade and monthly Zometa.  HPI: The patient was last seen in the medical oncology clinic on 12/31/2014.  At that time, she was doing well. Exam was unremarkable. She received her every 2 week Velcade and monthly Zometa.  Symptomatically, she has continued to do well. She voices no complaints. She has no neuropathy.   Past Medical History  Diagnosis Date  . Cancer     Multiple Myeloma  . Diabetes mellitus without complication   . Hypertension     Past Surgical History  Procedure Laterality Date  . Tubal ligation Left     Pt believes in was 1996  . Limbal stem cell transplant  2015    Family History  Problem Relation Age of Onset  . Cancer Mother   . Hypertension Mother   . Cancer Sister   . Diabetes Sister   . Crohn's disease Sister     Social History:  reports that she has never smoked. She does not have any smokeless tobacco history on file. She reports that she does not drink alcohol or use illicit drugs.  The patient is alone today  Allergies:  Allergies  Allergen Reactions  . Bee Venom Swelling    Current Medications: Current Outpatient Prescriptions  Medication Sig Dispense Refill  . amLODipine (NORVASC) 5 MG tablet Take 5 mg by mouth daily.    . ONE TOUCH ULTRA TEST test strip     . ONETOUCH DELICA LANCETS 93A MISC     . valACYclovir (VALTREX) 500 MG tablet Take 1 tablet (500 mg total) by mouth daily. 90 tablet 3   No current facility-administered medications for this visit.   Facility-Administered Medications Ordered in Other Visits  Medication Dose Route Frequency Provider Last Rate Last Dose  . bortezomib SQ (VELCADE) chemo injection 2.5 mg  1.3 mg/m2 (Order-Specific) Subcutaneous Once Lequita Asal,  MD   2.5 mg at 01/14/15 1458   Review of Systems:  GENERAL:  Feels fine.  No fevers, sweats or weight loss. PERFORMANCE STATUS (ECOG):  1 HEENT:  No visual changes, runny nose, sore throat, mouth sores or tenderness. Lungs: No shortness of breath or cough.  No hemoptysis. Cardiac:  No chest pain, palpitations, orthopnea, or PND. GI:  No nausea, vomiting, diarrhea, constipation, melena or hematochezia. GU:  No urgency, frequency, dysuria, or hematuria. Musculoskeletal:  No back pain.  No joint pain.  No muscle tenderness. Extremities:  No pain or swelling. Skin:  No rashes or skin changes. Neuro:  No headache, numbness or weakness, balance or coordination issues. Endocrine:  No diabetes, thyroid issues, hot flashes or night sweats. Psych:  No mood changes, depression or anxiety. Pain:  No focal pain. Review of systems:  All other systems reviewed and found to be negative.  Physical Exam: Blood pressure 140/92, pulse 96, temperature 98 F (36.7 C), temperature source Oral, weight 216 lb 0.8 oz (98 kg). GENERAL:  Well developed, well nourished, sitting comfortably in the exam room in no acute distress. MENTAL STATUS:  Alert and oriented to person, place and time. HEAD: Wearing a cap.  Normocephalic, atraumatic, face symmetric, no Cushingoid features. EYES:  Brown eyes.  Pupils equal round and reactive to light and accomodation.  No conjunctivitis or scleral icterus. ENT:  Oropharynx clear  without lesion.  Tongue normal. Mucous membranes moist.  RESPIRATORY:  Clear to auscultation without rales, wheezes or rhonchi. CARDIOVASCULAR:  Regular rate and rhythm without murmur, rub or gallop. ABDOMEN:  Soft, non-tender, with active bowel sounds, and no hepatosplenomegaly.  No masses. SKIN:  No rashes, ulcers or lesions. EXTREMITIES: No edema, no skin discoloration or tenderness.  No palpable cords. LYMPH NODES: No palpable cervical, supraclavicular, axillary or inguinal adenopathy   NEUROLOGICAL: Unremarkable. PSYCH:  Appropriate.  Assessment:  Annette Jimenez is a 54 y.o. female with stage III IgG lambda multiple myeloma. She presented with acute back pain and compression fracture of T2 on 02/02/2013. SPEP revealed 1.9 g/dL IgG lambda monoclonal protein. Kappa free light chains were 11.3 and lambda free light chains were 42.68. Bone survey on 02/26/2013 revealed L2 lytic lesion and C6 inferior endplate subtle concave compression deformity. Bone marrow biopsy revealed 11% plasma cells by aspirate and 30% plasma cells by CD138. Cytogenetics were normal. FISH showed FGFR3/IGH translocation t(4;14), monosomy 13, and gain of 1q21.   She received radiation to T2. She received RVD chemotherapy. She underwent autologous stem cell transplant on 12/10/2013. Following transplant in 02/2014, she began Velcade every 2 weeks for maintenance therapy. She receives Zometa monthly (last 12/31/2014).  Clinically, she is doing well.  She denies any complaint.  Exam is unremarkable.  Plan: 1. Review CBC from [facility]. 2. Velcade and Zometa today. 3. Complete [facility] slips for next 2 visits.  Labs at next check (CBC with diff, CMP, SPEP). 4. RTC in 2 weeks for Velcade. 5. RTC in 4 weeks for MD assessment, review of labs, Velcade and Zometa.   Lequita Asal, MD  01/28/2015

## 2015-02-11 ENCOUNTER — Inpatient Hospital Stay: Payer: 59

## 2015-02-11 DIAGNOSIS — C9002 Multiple myeloma in relapse: Secondary | ICD-10-CM

## 2015-02-11 DIAGNOSIS — Z5111 Encounter for antineoplastic chemotherapy: Secondary | ICD-10-CM | POA: Diagnosis not present

## 2015-02-11 MED ORDER — ONDANSETRON HCL 4 MG PO TABS
4.0000 mg | ORAL_TABLET | Freq: Once | ORAL | Status: AC
Start: 2015-02-11 — End: 2015-02-11
  Administered 2015-02-11: 4 mg via ORAL
  Filled 2015-02-11: qty 1

## 2015-02-11 MED ORDER — BORTEZOMIB CHEMO SQ INJECTION 3.5 MG (2.5MG/ML)
1.3000 mg/m2 | Freq: Once | INTRAMUSCULAR | Status: AC
  Administered 2015-02-11: 2.5 mg via SUBCUTANEOUS
  Filled 2015-02-11: qty 2.5

## 2015-02-22 ENCOUNTER — Telehealth: Payer: Self-pay | Admitting: *Deleted

## 2015-02-22 NOTE — Telephone Encounter (Signed)
Needs vaccinations; states that Dr. Mike Gip has the paperwork needed. Please fax to her PMD, Ezequiel Kayser, MD with [facility]; fax [phone]; pH. [phone]

## 2015-02-24 ENCOUNTER — Encounter: Payer: Self-pay | Admitting: Hematology and Oncology

## 2015-02-25 ENCOUNTER — Other Ambulatory Visit: Payer: 59

## 2015-02-25 ENCOUNTER — Encounter: Payer: Self-pay | Admitting: Hematology and Oncology

## 2015-02-25 ENCOUNTER — Inpatient Hospital Stay (HOSPITAL_BASED_OUTPATIENT_CLINIC_OR_DEPARTMENT_OTHER): Payer: 59 | Admitting: Hematology and Oncology

## 2015-02-25 ENCOUNTER — Inpatient Hospital Stay: Payer: 59

## 2015-02-25 VITALS — BP 143/94 | HR 89 | Temp 98.6°F | Ht 61.0 in | Wt 191.8 lb

## 2015-02-25 DIAGNOSIS — I10 Essential (primary) hypertension: Secondary | ICD-10-CM

## 2015-02-25 DIAGNOSIS — Z5111 Encounter for antineoplastic chemotherapy: Secondary | ICD-10-CM | POA: Diagnosis not present

## 2015-02-25 DIAGNOSIS — Z79899 Other long term (current) drug therapy: Secondary | ICD-10-CM

## 2015-02-25 DIAGNOSIS — Z923 Personal history of irradiation: Secondary | ICD-10-CM

## 2015-02-25 DIAGNOSIS — C9001 Multiple myeloma in remission: Secondary | ICD-10-CM | POA: Diagnosis not present

## 2015-02-25 DIAGNOSIS — Z9484 Stem cells transplant status: Secondary | ICD-10-CM

## 2015-02-25 DIAGNOSIS — E119 Type 2 diabetes mellitus without complications: Secondary | ICD-10-CM

## 2015-02-25 DIAGNOSIS — C9002 Multiple myeloma in relapse: Secondary | ICD-10-CM

## 2015-02-25 DIAGNOSIS — Z809 Family history of malignant neoplasm, unspecified: Secondary | ICD-10-CM

## 2015-02-25 MED ORDER — SODIUM CHLORIDE 0.9 % IV SOLN
Freq: Once | INTRAVENOUS | Status: AC
  Administered 2015-02-25: 15:00:00 via INTRAVENOUS
  Filled 2015-02-25: qty 1000

## 2015-02-25 MED ORDER — BORTEZOMIB CHEMO SQ INJECTION 3.5 MG (2.5MG/ML)
1.3000 mg/m2 | Freq: Once | INTRAMUSCULAR | Status: AC
  Administered 2015-02-25: 2.5 mg via SUBCUTANEOUS
  Filled 2015-02-25: qty 1

## 2015-02-25 MED ORDER — ONDANSETRON HCL 4 MG PO TABS
4.0000 mg | ORAL_TABLET | Freq: Once | ORAL | Status: AC
Start: 2015-02-25 — End: 2015-02-25
  Administered 2015-02-25: 4 mg via ORAL
  Filled 2015-02-25: qty 1

## 2015-02-25 MED ORDER — ZOLEDRONIC ACID 4 MG/100ML IV SOLN
4.0000 mg | Freq: Once | INTRAVENOUS | Status: AC
  Administered 2015-02-25: 4 mg via INTRAVENOUS
  Filled 2015-02-25: qty 100

## 2015-02-25 NOTE — Progress Notes (Signed)
[facility] day:  02/25/2015  Chief Complaint: Annette Jimenez is an 54 y.o. female with multiple myeloma status post autologous stem cell transplant who is seen for assessment prior to every other week Velcade and monthly Zometa.  HPI: The patient was last seen in the medical oncology clinic on 01/28/2015.  At that time, she was doing well without complaint. Specifically she denied any bone pain and neuropathy or issues with infection. CBC was normal. She received her Velcade and Zometa uneventfully.  She received Velcade again on 02/11/2015.  During the interim, the patient voices no complaint.  She denies any symptoms. She is tolerating her therapy well.  Labs from 02/24/2015 revealed a hematocrit of 34.4, hemoglobin 11.8, MCV 95, platelets 267,000, white count 5900 with an ANC of 3100. Comprehensive metabolic panel revealed a creatinine of 0.88, calcium 9.5, protein 7.2, albumen 4.6, and liver function tests normal..   Past Medical History  Diagnosis Date  . Cancer     Multiple Myeloma  . Diabetes mellitus without complication   . Hypertension     Past Surgical History  Procedure Laterality Date  . Tubal ligation Left     Pt believes in was 1996  . Limbal stem cell transplant  2015    Family History  Problem Relation Age of Onset  . Cancer Mother   . Hypertension Mother   . Cancer Sister   . Diabetes Sister   . Crohn's disease Sister     Social History:  reports that she has never smoked. She does not have any smokeless tobacco history on file. She reports that she does not drink alcohol or use illicit drugs.  The patient is alone today  Allergies:  Allergies  Allergen Reactions  . Bee Venom Swelling    Current Medications: Current Outpatient Prescriptions  Medication Sig Dispense Refill  . amLODipine (NORVASC) 5 MG tablet Take 5 mg by mouth daily.    . ONE TOUCH ULTRA TEST test strip     . ONETOUCH DELICA LANCETS 16P  MISC     . valACYclovir (VALTREX) 500 MG tablet Take 500 mg by mouth daily.     No current facility-administered medications for this visit.   Facility-Administered Medications Ordered in Other Visits  Medication Dose Route Frequency Provider Last Rate Last Dose  . bortezomib SQ (VELCADE) chemo injection 2.5 mg  1.3 mg/m2 (Order-Specific) Subcutaneous Once Lequita Asal, MD   2.5 mg at 01/14/15 1458   Review of Systems:  GENERAL:  Feels good.  No concerns.  No fevers, sweats or weight loss. PERFORMANCE STATUS (ECOG):  1 HEENT:  No visual changes, runny nose, sore throat, mouth sores or tenderness. Lungs: No shortness of breath or cough.  No hemoptysis. Cardiac:  No chest pain, palpitations, orthopnea, or PND. GI:  No nausea, vomiting, diarrhea, constipation, melena or hematochezia. GU:  No urgency, frequency, dysuria, or hematuria. Musculoskeletal:  No back pain.  No joint pain.  No muscle tenderness. Extremities:  No pain or swelling. Skin:  No rashes or skin changes. Neuro:  No headache, numbness or weakness, balance or coordination issues. Endocrine:  No diabetes, thyroid issues, hot flashes or night sweats. Psych:  No mood changes, depression or anxiety. Pain:  No focal pain. Review of systems:  All other systems reviewed and found to be negative.  Physical Exam: Blood pressure 143/94, pulse 89, temperature 98.6 F (37 C), temperature source Tympanic, height '5\' 1"'  (1.549 m), weight 191  lb 12.8 oz (87 kg). GENERAL:  Well developed, well nourished, sitting comfortably in the exam room in no acute distress. MENTAL STATUS:  Alert and oriented to person, place and time. HEAD:  Normocephalic, atraumatic, face symmetric, no Cushingoid features. EYES:  Brown eyes.  Pupils equal round and reactive to light and accomodation.  No conjunctivitis or scleral icterus. ENT:  Oropharynx clear without lesion.  Tongue normal. Mucous membranes moist.  RESPIRATORY:  Clear to auscultation  without rales, wheezes or rhonchi. CARDIOVASCULAR:  Regular rate and rhythm without murmur, rub or gallop. ABDOMEN:  Soft, non-tender, with active bowel sounds, and no hepatosplenomegaly.  No masses. SKIN:  No rashes, ulcers or lesions. EXTREMITIES: No edema, no skin discoloration or tenderness.  No palpable cords. LYMPH NODES: No palpable cervical, supraclavicular, axillary or inguinal adenopathy  NEUROLOGICAL: Unremarkable. PSYCH:  Appropriate.  Assessment:  Annette Jimenez is a 54 y.o. female with stage III IgG lambda multiple myeloma. She presented with acute back pain and compression fracture of T2 on 02/02/2013. SPEP revealed 1.9 g/dL IgG lambda monoclonal protein. Kappa free light chains were 11.3 and lambda free light chains were 42.68. Bone survey on 02/26/2013 revealed L2 lytic lesion and C6 inferior endplate subtle concave compression deformity. Bone marrow biopsy revealed 11% plasma cells by aspirate and 30% plasma cells by CD138. Cytogenetics were normal. FISH showed FGFR3/IGH translocation t(4;14), monosomy 13, and gain of 1q21.   She received radiation to T2. She received RVD chemotherapy. She underwent autologous stem cell transplant on 12/10/2013. Following transplant in 02/2014, she began Velcade every 2 weeks for maintenance therapy. She receives Zometa monthly (last 01/28/2015).  Clinically, she is doing well. She denies any complaint. Exam is unremarkable.  Plan: 1. Review labs from [facility]. 2. Chemotherapy orders written.  Velcade and Zometa today. 3. Complete [facility] slips for next 2 visits. 4. RTC in 2 weeks review of labs and Velcade. 5. RTC in 4 weeks for MD assessment , review of labs, Velcade and Zometa.  Lequita Asal, MD  02/25/2015

## 2015-02-25 NOTE — Progress Notes (Signed)
Pt here today for follow up and Velcade/Zometa treatment; offers no complaints today

## 2015-03-10 ENCOUNTER — Encounter: Payer: Self-pay | Admitting: Hematology and Oncology

## 2015-03-10 NOTE — Telephone Encounter (Signed)
  Do you know if this was resolved?  M

## 2015-03-10 NOTE — Telephone Encounter (Signed)
Not to my knowledge. I forwarded it to your team.

## 2015-03-11 ENCOUNTER — Inpatient Hospital Stay: Payer: 59 | Attending: Hematology and Oncology

## 2015-03-11 DIAGNOSIS — I10 Essential (primary) hypertension: Secondary | ICD-10-CM | POA: Insufficient documentation

## 2015-03-11 DIAGNOSIS — Z9481 Bone marrow transplant status: Secondary | ICD-10-CM | POA: Insufficient documentation

## 2015-03-11 DIAGNOSIS — C9002 Multiple myeloma in relapse: Secondary | ICD-10-CM | POA: Diagnosis present

## 2015-03-11 DIAGNOSIS — E119 Type 2 diabetes mellitus without complications: Secondary | ICD-10-CM | POA: Diagnosis not present

## 2015-03-11 DIAGNOSIS — Z5111 Encounter for antineoplastic chemotherapy: Secondary | ICD-10-CM | POA: Insufficient documentation

## 2015-03-11 DIAGNOSIS — Z809 Family history of malignant neoplasm, unspecified: Secondary | ICD-10-CM | POA: Insufficient documentation

## 2015-03-11 DIAGNOSIS — K130 Diseases of lips: Secondary | ICD-10-CM | POA: Insufficient documentation

## 2015-03-11 DIAGNOSIS — Z923 Personal history of irradiation: Secondary | ICD-10-CM | POA: Insufficient documentation

## 2015-03-11 DIAGNOSIS — Z79899 Other long term (current) drug therapy: Secondary | ICD-10-CM | POA: Insufficient documentation

## 2015-03-11 MED ORDER — ONDANSETRON HCL 4 MG PO TABS
4.0000 mg | ORAL_TABLET | Freq: Once | ORAL | Status: AC
  Administered 2015-03-11: 4 mg via ORAL
  Filled 2015-03-11: qty 1

## 2015-03-11 MED ORDER — BORTEZOMIB CHEMO SQ INJECTION 3.5 MG (2.5MG/ML)
1.3000 mg/m2 | Freq: Once | INTRAMUSCULAR | Status: AC
  Administered 2015-03-11: 2.5 mg via SUBCUTANEOUS
  Filled 2015-03-11: qty 1

## 2015-03-11 NOTE — Telephone Encounter (Signed)
Do not know anything about this pt or when paperwork was placed I just asked Dr. Ivette Loyal, and everyone if they have seen this paperwork and they know nothing of it. Please follow up on this call.

## 2015-03-14 NOTE — Telephone Encounter (Signed)
You will need to call pt. She stated that Dr. Mike Gip has the information as previously stated.  Thanks.

## 2015-03-17 ENCOUNTER — Other Ambulatory Visit: Payer: Self-pay | Admitting: *Deleted

## 2015-03-17 MED ORDER — VALACYCLOVIR HCL 500 MG PO TABS: 500.0000 mg | ORAL_TABLET | Freq: Every day | ORAL | Status: DC

## 2015-03-22 ENCOUNTER — Encounter: Payer: Self-pay | Admitting: Hematology and Oncology

## 2015-03-25 ENCOUNTER — Encounter: Payer: Self-pay | Admitting: Hematology and Oncology

## 2015-03-25 ENCOUNTER — Inpatient Hospital Stay (HOSPITAL_BASED_OUTPATIENT_CLINIC_OR_DEPARTMENT_OTHER): Payer: 59 | Admitting: Hematology and Oncology

## 2015-03-25 ENCOUNTER — Telehealth: Payer: Self-pay | Admitting: Hematology and Oncology

## 2015-03-25 ENCOUNTER — Inpatient Hospital Stay: Payer: 59

## 2015-03-25 VITALS — BP 129/89 | HR 91 | Temp 95.9°F | Ht 61.0 in | Wt 191.6 lb

## 2015-03-25 DIAGNOSIS — E119 Type 2 diabetes mellitus without complications: Secondary | ICD-10-CM | POA: Diagnosis not present

## 2015-03-25 DIAGNOSIS — Z923 Personal history of irradiation: Secondary | ICD-10-CM

## 2015-03-25 DIAGNOSIS — C9002 Multiple myeloma in relapse: Secondary | ICD-10-CM | POA: Diagnosis not present

## 2015-03-25 DIAGNOSIS — Z79899 Other long term (current) drug therapy: Secondary | ICD-10-CM | POA: Diagnosis not present

## 2015-03-25 DIAGNOSIS — Z809 Family history of malignant neoplasm, unspecified: Secondary | ICD-10-CM

## 2015-03-25 DIAGNOSIS — K130 Diseases of lips: Secondary | ICD-10-CM

## 2015-03-25 DIAGNOSIS — I10 Essential (primary) hypertension: Secondary | ICD-10-CM

## 2015-03-25 DIAGNOSIS — Z9481 Bone marrow transplant status: Secondary | ICD-10-CM

## 2015-03-25 DIAGNOSIS — C9001 Multiple myeloma in remission: Secondary | ICD-10-CM

## 2015-03-25 MED ORDER — ZOLEDRONIC ACID 4 MG/100ML IV SOLN
4.0000 mg | Freq: Once | INTRAVENOUS | Status: AC
  Administered 2015-03-25: 4 mg via INTRAVENOUS
  Filled 2015-03-25: qty 100

## 2015-03-25 MED ORDER — BORTEZOMIB CHEMO SQ INJECTION 3.5 MG (2.5MG/ML)
1.3000 mg/m2 | Freq: Once | INTRAMUSCULAR | Status: AC
  Administered 2015-03-25: 2.5 mg via SUBCUTANEOUS
  Filled 2015-03-25: qty 2.5

## 2015-03-25 MED ORDER — ONDANSETRON HCL 4 MG PO TABS
4.0000 mg | ORAL_TABLET | Freq: Once | ORAL | Status: AC
  Administered 2015-03-25: 4 mg via ORAL
  Filled 2015-03-25: qty 1

## 2015-03-25 MED ORDER — SODIUM CHLORIDE 0.9 % IV SOLN
Freq: Once | INTRAVENOUS | Status: AC
  Administered 2015-03-25: 16:00:00 via INTRAVENOUS
  Filled 2015-03-25: qty 1000

## 2015-03-25 NOTE — Progress Notes (Signed)
[facility] day:  03/25/2015  Chief Complaint: Annette Jimenez is a 54 y.o. female with multiple myeloma status post autologous stem cell transplant who is seen for assessment prior to every other week Velcade and monthly Zometa.  HPI: The patient was last seen in the medical oncology clinic on 02/25/2015.  At that time, she was doing well.  She received her Velcade and Zometa uneventfully.  She received her Velcade again on 03/11/2015.  During the interim, she has continued to do well. She notes a small sore on the inside of her upper lip.  She denies any other lesions. She denies any fevers. She denies any bone pain.  Free light chain were normal on 03/22/2015 and included kappa free light chains of 17.45, lambda free light chains of 16.21, and a ratio of 1.08 (normal).   Past Medical History  Diagnosis Date  . Cancer     Multiple Myeloma  . Diabetes mellitus without complication   . Hypertension     Past Surgical History  Procedure Laterality Date  . Tubal ligation Left     Pt believes in was 1996  . Limbal stem cell transplant  2015    Family History  Problem Relation Age of Onset  . Cancer Mother   . Hypertension Mother   . Cancer Sister   . Diabetes Sister   . Crohn's disease Sister     Social History:  reports that she has never smoked. She does not have any smokeless tobacco history on file. She reports that she does not drink alcohol or use illicit drugs.  The patient is alone today  Allergies:  Allergies  Allergen Reactions  . Bee Venom Swelling    Current Medications: Current Outpatient Prescriptions  Medication Sig Dispense Refill  . amLODipine (NORVASC) 5 MG tablet Take 5 mg by mouth daily.    . ONE TOUCH ULTRA TEST test strip     . ONETOUCH DELICA LANCETS 86L MISC     . valACYclovir (VALTREX) 500 MG tablet Take 1 tablet (500 mg total) by mouth daily. 90 tablet 3   No current facility-administered  medications for this visit.   Facility-Administered Medications Ordered in Other Visits  Medication Dose Route Frequency Provider Last Rate Last Dose  . bortezomib SQ (VELCADE) chemo injection 2.5 mg  1.3 mg/m2 (Order-Specific) Subcutaneous Once Lequita Asal, MD   2.5 mg at 01/14/15 1458   Review of Systems:  GENERAL:  Feels "ok".  Active.  No fevers, sweats or weight loss. PERFORMANCE STATUS (ECOG):  0 HEENT:  Small sore upper lip.  No visual changes, runny nose, sore throat, mouth sores or tenderness. Lungs: No shortness of breath or cough.  No hemoptysis. Cardiac:  No chest pain, palpitations, orthopnea, or PND. GI:  No nausea, vomiting, diarrhea, constipation, melena or hematochezia. GU:  No urgency, frequency, dysuria, or hematuria. Musculoskeletal:  No back pain.  No joint pain.  No muscle tenderness. Extremities:  No pain or swelling. Skin:  No rashes or skin changes. Neuro:  No headache, numbness or weakness, balance or coordination issues. Endocrine:  No diabetes, thyroid issues, hot flashes or night sweats. Psych:  No mood changes, depression or anxiety. Pain:  No focal pain. Review of systems:  All other systems reviewed and found to be negative.  Physical Exam: Blood pressure 129/89, pulse 91, temperature 95.9 F (35.5 C), temperature source Tympanic, height '5\' 1"'  (1.549 m), weight 191 lb 9.3 oz (86.9  kg). GENERAL:  Well developed, well nourished, sitting comfortably in the exam room in no acute distress. MENTAL STATUS:  Alert and oriented to person, place and time. HEAD:  Normocephalic, atraumatic, face symmetric, no Cushingoid features. EYES:  Brown eyes.  Pupils equal round and reactive to light and accomodation.  No conjunctivitis or scleral icterus. ENT:  Tiny upper lip lesion.  No thrush.  Tongue normal. Mucous membranes moist.  RESPIRATORY:  Clear to auscultation without rales, wheezes or rhonchi. CARDIOVASCULAR:  Regular rate and rhythm without murmur, rub or  gallop. ABDOMEN:  Soft, non-tender, with active bowel sounds, and no hepatosplenomegaly.  No masses. SKIN:  No rashes, ulcers or lesions. EXTREMITIES: No edema, no skin discoloration or tenderness.  No palpable cords. LYMPH NODES: No palpable cervical, supraclavicular, axillary or inguinal adenopathy  NEUROLOGICAL: Unremarkable. PSYCH:  Appropriate.  Assessment:  Annette Jimenez is a 54 y.o. female with stage III IgG lambda multiple myeloma. She presented with acute back pain and compression fracture of T2 on 02/02/2013. SPEP revealed 1.9 g/dL IgG lambda monoclonal protein. Kappa free light chains were 11.3 and lambda free light chains were 42.68. Bone survey on 02/26/2013 revealed L2 lytic lesion and C6 inferior endplate subtle concave compression deformity. Bone marrow biopsy revealed 11% plasma cells by aspirate and 30% plasma cells by CD138. Cytogenetics were normal. FISH showed FGFR3/IGH translocation t(4;14), monosomy 13, and gain of 1q21.   She received radiation to T2. She received RVD chemotherapy. She underwent autologous stem cell transplant on 12/10/2013. Following transplant in 02/2014, she began Velcade every 2 weeks for maintenance therapy. She receives Zometa monthly (last 02/25/2015).  SPEP revealed no monoclonal protein on 01/26/2015.  Light chains were normal on 03/22/2015.  Clinically, she is doing well. She notes a small sore on her upper lip. She denies any fever.  Exam is unremarkable.  Plan: 1. Review results from [facility]. 2. Velcade and Zometa today. 3. Complete [facility] slips for next 2 visits. 4. RTC in 2 weeks for review of labs and Velcade. 5. RTC in 4 weeks for MD assessment , review of labs, Velcade and Zometa.   Lequita Asal, MD  03/25/2015, 2:10 PM

## 2015-03-25 NOTE — Progress Notes (Signed)
No changes other than a small sore on the inside of upper lip.  Follow up for multiple myeloma

## 2015-04-01 ENCOUNTER — Telehealth: Payer: Self-pay | Admitting: *Deleted

## 2015-04-01 NOTE — Telephone Encounter (Signed)
Needs [facility] order forms

## 2015-04-01 NOTE — Telephone Encounter (Signed)
Called and told pt her orders will be ready at lunch time

## 2015-04-08 ENCOUNTER — Telehealth: Payer: Self-pay | Admitting: Pharmacist

## 2015-04-08 ENCOUNTER — Inpatient Hospital Stay: Payer: 59 | Attending: Hematology and Oncology

## 2015-04-08 VITALS — BP 123/86 | HR 84 | Temp 97.2°F | Resp 18

## 2015-04-08 DIAGNOSIS — Z79899 Other long term (current) drug therapy: Secondary | ICD-10-CM | POA: Insufficient documentation

## 2015-04-08 DIAGNOSIS — C9002 Multiple myeloma in relapse: Secondary | ICD-10-CM

## 2015-04-08 DIAGNOSIS — Z923 Personal history of irradiation: Secondary | ICD-10-CM | POA: Diagnosis not present

## 2015-04-08 DIAGNOSIS — Z5111 Encounter for antineoplastic chemotherapy: Secondary | ICD-10-CM | POA: Diagnosis not present

## 2015-04-08 DIAGNOSIS — I10 Essential (primary) hypertension: Secondary | ICD-10-CM | POA: Diagnosis not present

## 2015-04-08 DIAGNOSIS — E119 Type 2 diabetes mellitus without complications: Secondary | ICD-10-CM | POA: Insufficient documentation

## 2015-04-08 DIAGNOSIS — M25551 Pain in right hip: Secondary | ICD-10-CM | POA: Diagnosis not present

## 2015-04-08 DIAGNOSIS — Z9484 Stem cells transplant status: Secondary | ICD-10-CM | POA: Diagnosis not present

## 2015-04-08 DIAGNOSIS — Z23 Encounter for immunization: Secondary | ICD-10-CM | POA: Diagnosis not present

## 2015-04-08 MED ORDER — DTAP-HEPATITIS B RECOMB-IPV IM SUSP
0.5000 mL | Freq: Once | INTRAMUSCULAR | Status: AC
  Administered 2015-04-08: 0.5 mL via INTRAMUSCULAR
  Filled 2015-04-08: qty 0.5

## 2015-04-08 MED ORDER — HAEMOPHILUS B POLYSAC CONJ VAC IM SOLR
0.5000 mL | Freq: Once | INTRAMUSCULAR | Status: AC
  Administered 2015-04-08: 0.5 mL via INTRAMUSCULAR
  Filled 2015-04-08: qty 0.5

## 2015-04-08 MED ORDER — BORTEZOMIB CHEMO SQ INJECTION 3.5 MG (2.5MG/ML)
1.3000 mg/m2 | Freq: Once | INTRAMUSCULAR | Status: AC
  Administered 2015-04-08: 2.5 mg via SUBCUTANEOUS
  Filled 2015-04-08: qty 2.5

## 2015-04-08 MED ORDER — PNEUMOCOCCAL 13-VAL CONJ VACC IM SUSP
0.5000 mL | INTRAMUSCULAR | Status: AC
  Administered 2015-04-08: 0.5 mL via INTRAMUSCULAR
  Filled 2015-04-08: qty 0.5

## 2015-04-08 MED ORDER — ONDANSETRON HCL 4 MG PO TABS
4.0000 mg | ORAL_TABLET | Freq: Once | ORAL | Status: AC
  Administered 2015-04-08: 4 mg via ORAL
  Filled 2015-04-08: qty 1

## 2015-04-08 NOTE — Telephone Encounter (Signed)
Labs from [facility] reviewed

## 2015-04-18 ENCOUNTER — Encounter: Payer: Self-pay | Admitting: Hematology and Oncology

## 2015-04-22 ENCOUNTER — Inpatient Hospital Stay: Payer: 59

## 2015-04-22 ENCOUNTER — Inpatient Hospital Stay (HOSPITAL_BASED_OUTPATIENT_CLINIC_OR_DEPARTMENT_OTHER): Payer: 59 | Admitting: Hematology and Oncology

## 2015-04-22 VITALS — BP 134/76 | HR 91 | Temp 98.5°F | Wt 189.2 lb

## 2015-04-22 DIAGNOSIS — Z79899 Other long term (current) drug therapy: Secondary | ICD-10-CM

## 2015-04-22 DIAGNOSIS — M25551 Pain in right hip: Secondary | ICD-10-CM

## 2015-04-22 DIAGNOSIS — E119 Type 2 diabetes mellitus without complications: Secondary | ICD-10-CM | POA: Diagnosis not present

## 2015-04-22 DIAGNOSIS — Z9484 Stem cells transplant status: Secondary | ICD-10-CM

## 2015-04-22 DIAGNOSIS — C9002 Multiple myeloma in relapse: Secondary | ICD-10-CM | POA: Diagnosis not present

## 2015-04-22 DIAGNOSIS — C9001 Multiple myeloma in remission: Secondary | ICD-10-CM

## 2015-04-22 DIAGNOSIS — Z923 Personal history of irradiation: Secondary | ICD-10-CM

## 2015-04-22 DIAGNOSIS — I10 Essential (primary) hypertension: Secondary | ICD-10-CM

## 2015-04-22 DIAGNOSIS — Z23 Encounter for immunization: Secondary | ICD-10-CM

## 2015-04-22 MED ORDER — BORTEZOMIB CHEMO SQ INJECTION 3.5 MG (2.5MG/ML)
1.3000 mg/m2 | Freq: Once | INTRAMUSCULAR | Status: AC
  Administered 2015-04-22: 2.5 mg via SUBCUTANEOUS
  Filled 2015-04-22: qty 1

## 2015-04-22 MED ORDER — SODIUM CHLORIDE 0.9 % IV SOLN
INTRAVENOUS | Status: DC
  Administered 2015-04-22: 12:00:00 via INTRAVENOUS
  Filled 2015-04-22: qty 1000

## 2015-04-22 MED ORDER — ZOLEDRONIC ACID 4 MG/5ML IV CONC
4.0000 mg | Freq: Once | INTRAVENOUS | Status: DC
  Filled 2015-04-22: qty 5

## 2015-04-22 MED ORDER — ZOLEDRONIC ACID 4 MG/100ML IV SOLN
4.0000 mg | Freq: Once | INTRAVENOUS | Status: AC
  Administered 2015-04-22: 4 mg via INTRAVENOUS
  Filled 2015-04-22: qty 100

## 2015-04-22 MED ORDER — ONDANSETRON HCL 4 MG PO TABS
4.0000 mg | ORAL_TABLET | Freq: Once | ORAL | Status: AC
  Administered 2015-04-22: 4 mg via ORAL
  Filled 2015-04-22: qty 1

## 2015-04-22 NOTE — Progress Notes (Signed)
Patient complains of more pain in right hip area. Cannot sleep on that side.

## 2015-04-22 NOTE — Progress Notes (Signed)
[facility] day:  04/22/2015  Chief Complaint: Annette Jimenez is an 54 y.o. female with multiple myeloma status post autologous stem cell transplant who is seen for assessment prior to every other week Velcade and monthly Zometa.  HPI: The patient was last seen in the medical oncology clinic on 03/25/2015.  At that time, she was doing well.  She has continued her every other week Velcade.  She received Velcade on 04/08/2015.  Labs on 04/07/2015 revealed a hematocrit 33.6, hemoglobin 11.2, MCV 95, platelets 280,000, white count 5500 with an [place] of 3100. Comprehensive metabolic panel included a creatinine of 0.97, calcium 9.2, albumen 4.2, protein 6.9, and normal liver function tests.  She has continued her immunizations post transplant.  She received DTaP-hepatitis B recombinant-IPV ([place]), pneumococcal 13-valent conjugate vaccine (PREVNAR-13), and haemophilus B-tetanus toxoid vaccine (ActHIB) on 04/08/2015.  Symptomatically, the patient has done well. She does note a little bit of right hip pain more often than she used to. It goes away. She denies any other bone or joint pain. She denies any fevers or infections.  Labs on 04/18/2015 revealed a hematocrit 34, hemoglobin 11.6, MCV 95, platelets 265,000, white count 5800 with an [place] of 3200. Comprehensive metabolic panel was normal with a creatinine of 0.88, calcium 9.1, albumen 4.3, and normal liver function tests.  Light chains were normal including kappa free light chains of 19.33, lambda free light chains of 15.70, and a ratio of 1.23.  SPEP is pending.   Past Medical History  Diagnosis Date  . Cancer     Multiple Myeloma  . Diabetes mellitus without complication   . Hypertension     Past Surgical History  Procedure Laterality Date  . Tubal ligation Left     Pt believes in was 1996  . Limbal stem cell transplant  2015    Family History  Problem Relation Age of Onset  . Cancer Mother    . Hypertension Mother   . Cancer Sister   . Diabetes Sister   . Crohn's disease Sister     Social History:  reports that she has never smoked. She does not have any smokeless tobacco history on file. She reports that she does not drink alcohol or use illicit drugs.  The patient is alone today  Allergies:  Allergies  Allergen Reactions  . Bee Venom Swelling    Current Medications: Current Outpatient Prescriptions  Medication Sig Dispense Refill  . amLODipine (NORVASC) 5 MG tablet Take 5 mg by mouth daily.    . ONE TOUCH ULTRA TEST test strip     . ONETOUCH DELICA LANCETS 73Z MISC     . valACYclovir (VALTREX) 500 MG tablet Take 1 tablet (500 mg total) by mouth daily. 90 tablet 3   No current facility-administered medications for this visit.   Facility-Administered Medications Ordered in Other Visits  Medication Dose Route Frequency Provider Last Rate Last Dose  . bortezomib SQ (VELCADE) chemo injection 2.5 mg  1.3 mg/m2 (Order-Specific) Subcutaneous Once Lequita Asal, MD   2.5 mg at 01/14/15 1458   Review of Systems:  GENERAL:  Feels fine.  No fevers, sweats or weight loss. PERFORMANCE STATUS (ECOG):  0 HEENT:  No visual changes, runny nose, sore throat, mouth sores or tenderness. Lungs: No shortness of breath or cough.  No hemoptysis. Cardiac:  No chest pain, palpitations, orthopnea, or PND. GI:  No nausea, vomiting, diarrhea, constipation, melena or hematochezia. GU:  No urgency, frequency,  dysuria, or hematuria. Musculoskeletal:  No back pain.  Intermittent right hip pain.  No muscle tenderness. Extremities:  No pain or swelling. Skin:  No rashes or skin changes. Neuro:  No headache, numbness or weakness, balance or coordination issues. Endocrine:  No diabetes, thyroid issues, hot flashes or night sweats. Psych:  No mood changes, depression or anxiety. Pain:  No focal pain. Review of systems:  All other systems reviewed and found to be negative.  Physical  Exam: Blood pressure 134/76, pulse 91, temperature 98.5 F (36.9 C), temperature source Tympanic, weight 189 lb 2.5 oz (85.8 kg). GENERAL:  Well developed, well nourished, sitting comfortably in the exam room in no acute distress. MENTAL STATUS:  Alert and oriented to person, place and time. HEAD:  Normocephalic, atraumatic, face symmetric, no Cushingoid features. EYES:  Brown eyes.  Pupils equal round and reactive to light and accomodation.  No conjunctivitis or scleral icterus. ENT:  Oropharynx clear without lesion.  Tongue normal. Mucous membranes moist.  RESPIRATORY:  Clear to auscultation without rales, wheezes or rhonchi. CARDIOVASCULAR:  Regular rate and rhythm without murmur, rub or gallop. ABDOMEN:  Soft, non-tender, with active bowel sounds, and no hepatosplenomegaly.  No masses. SKIN:  No rashes, ulcers or lesions. EXTREMITIES: No edema, no skin discoloration or tenderness.  No palpable cords. LYMPH NODES: No palpable cervical, supraclavicular, axillary or inguinal adenopathy  NEUROLOGICAL: Unremarkable. PSYCH:  Appropriate.  Assessment:  Annette Jimenez is a 54 y.o. female with stage III IgG lambda multiple myeloma. She presented with acute back pain and compression fracture of T2 on 02/02/2013. SPEP revealed 1.9 g/dL IgG lambda monoclonal protein. Kappa free light chains were 11.3 and lambda free light chains were 42.68. Bone survey on 02/26/2013 revealed L2 lytic lesion and C6 inferior endplate subtle concave compression deformity. Bone marrow biopsy revealed 11% plasma cells by aspirate and 30% plasma cells by CD138. Cytogenetics were normal. FISH showed FGFR3/IGH translocation t(4;14), monosomy 13, and gain of 1q21.   She received radiation to T2. She received RVD chemotherapy. She underwent autologous stem cell transplant on 12/10/2013. Following transplant in 02/2014, she began Velcade every 2 weeks for maintenance therapy. She receives Zometa monthly (last  03/25/2015). SPEP revealed no monoclonal protein on 01/26/2015. Light chains were normal on 03/22/2015 and 04/18/2015.  She has received immunizations post transplant.  She received her second round of [place], [place], and ActHIB on 04/08/2015.  Clinically, she notes a little right hip pain.  Pain is intermittent.  She denies any trauma.  Exam is unremarkable.  Plan: 1. Review labs from [facility]. 2. Velcade and Zometa today. 3. Complete [facility] slips for next 2 visits.  4. Discuss obtaining plain films of hip.  Patient declines. 5. Discuss plan for last set on immunizations around 06/08/2015. 6. RTC in 2 weeks for review of labs and Velcade. 7. RTC in 4 weeks for MD assessment , review of labs, Velcade and Zometa.   Lequita Asal, MD  04/22/2015, 9:55 AM

## 2015-04-25 ENCOUNTER — Encounter: Payer: Self-pay | Admitting: Hematology and Oncology

## 2015-05-04 ENCOUNTER — Encounter: Payer: Self-pay | Admitting: Hematology and Oncology

## 2015-05-06 ENCOUNTER — Inpatient Hospital Stay: Payer: 59 | Attending: Hematology and Oncology

## 2015-05-06 DIAGNOSIS — I10 Essential (primary) hypertension: Secondary | ICD-10-CM | POA: Insufficient documentation

## 2015-05-06 DIAGNOSIS — C9000 Multiple myeloma not having achieved remission: Secondary | ICD-10-CM | POA: Insufficient documentation

## 2015-05-06 DIAGNOSIS — Z79899 Other long term (current) drug therapy: Secondary | ICD-10-CM | POA: Insufficient documentation

## 2015-05-06 DIAGNOSIS — Z5111 Encounter for antineoplastic chemotherapy: Secondary | ICD-10-CM | POA: Insufficient documentation

## 2015-05-06 DIAGNOSIS — M25551 Pain in right hip: Secondary | ICD-10-CM | POA: Insufficient documentation

## 2015-05-06 DIAGNOSIS — E119 Type 2 diabetes mellitus without complications: Secondary | ICD-10-CM | POA: Insufficient documentation

## 2015-05-06 DIAGNOSIS — Z923 Personal history of irradiation: Secondary | ICD-10-CM | POA: Insufficient documentation

## 2015-05-06 DIAGNOSIS — Z9221 Personal history of antineoplastic chemotherapy: Secondary | ICD-10-CM | POA: Insufficient documentation

## 2015-05-06 DIAGNOSIS — Z9484 Stem cells transplant status: Secondary | ICD-10-CM | POA: Insufficient documentation

## 2015-05-18 ENCOUNTER — Encounter: Payer: Self-pay | Admitting: Hematology and Oncology

## 2015-05-20 ENCOUNTER — Inpatient Hospital Stay (HOSPITAL_BASED_OUTPATIENT_CLINIC_OR_DEPARTMENT_OTHER): Payer: 59 | Admitting: Hematology and Oncology

## 2015-05-20 ENCOUNTER — Inpatient Hospital Stay: Payer: 59

## 2015-05-20 ENCOUNTER — Other Ambulatory Visit: Payer: Self-pay | Admitting: Hematology and Oncology

## 2015-05-20 VITALS — BP 130/88 | HR 90 | Temp 97.8°F | Resp 18 | Ht 61.0 in | Wt 192.4 lb

## 2015-05-20 DIAGNOSIS — Z9484 Stem cells transplant status: Secondary | ICD-10-CM | POA: Diagnosis not present

## 2015-05-20 DIAGNOSIS — E119 Type 2 diabetes mellitus without complications: Secondary | ICD-10-CM

## 2015-05-20 DIAGNOSIS — Z9221 Personal history of antineoplastic chemotherapy: Secondary | ICD-10-CM | POA: Diagnosis not present

## 2015-05-20 DIAGNOSIS — Z923 Personal history of irradiation: Secondary | ICD-10-CM

## 2015-05-20 DIAGNOSIS — I10 Essential (primary) hypertension: Secondary | ICD-10-CM | POA: Diagnosis not present

## 2015-05-20 DIAGNOSIS — C9001 Multiple myeloma in remission: Secondary | ICD-10-CM

## 2015-05-20 DIAGNOSIS — Z79899 Other long term (current) drug therapy: Secondary | ICD-10-CM | POA: Diagnosis not present

## 2015-05-20 DIAGNOSIS — C9000 Multiple myeloma not having achieved remission: Secondary | ICD-10-CM | POA: Diagnosis present

## 2015-05-20 DIAGNOSIS — M25551 Pain in right hip: Secondary | ICD-10-CM

## 2015-05-20 DIAGNOSIS — Z5111 Encounter for antineoplastic chemotherapy: Secondary | ICD-10-CM | POA: Diagnosis not present

## 2015-05-20 DIAGNOSIS — C9002 Multiple myeloma in relapse: Secondary | ICD-10-CM

## 2015-05-20 MED ORDER — ONDANSETRON HCL 4 MG PO TABS
4.0000 mg | ORAL_TABLET | Freq: Once | ORAL | Status: AC
  Administered 2015-05-20: 4 mg via ORAL
  Filled 2015-05-20: qty 1

## 2015-05-20 MED ORDER — BORTEZOMIB CHEMO SQ INJECTION 3.5 MG (2.5MG/ML)
1.3000 mg/m2 | Freq: Once | INTRAMUSCULAR | Status: AC
  Administered 2015-05-20: 2.5 mg via SUBCUTANEOUS
  Filled 2015-05-20: qty 2.5

## 2015-05-20 MED ORDER — SODIUM CHLORIDE 0.9 % IV SOLN
INTRAVENOUS | Status: DC
  Administered 2015-05-20: 15:00:00 via INTRAVENOUS
  Filled 2015-05-20: qty 1000

## 2015-05-20 MED ORDER — ZOLEDRONIC ACID 4 MG/5ML IV CONC
4.0000 mg | Freq: Once | INTRAVENOUS | Status: AC
  Administered 2015-05-20: 4 mg via INTRAVENOUS
  Filled 2015-05-20: qty 5

## 2015-05-20 NOTE — Progress Notes (Signed)
[place] [facility] day:  05/20/2015  Chief Complaint: Annette Jimenez is an 54 y.o. female with multiple myeloma status post autologous stem cell transplant who is seen for assessment prior to every other week Velcade and monthly Zometa.  HPI: The patient was last seen in the medical oncology clinic on 04/22/2015.  At that time, she noted a little right hip pain.  She declined plain films.  She received Velcade and Zometa.  She did not receive Velcade 2 weeks later secondary to insurance preauthorization.  Labs on 05/18/2015 revealed a hematocrit 34.3, hemoglobin 11.8, MCV 96, platelets 248,000, white count 5800 with an ANC of 3200. Comprehensive metabolic panel included a creatinine of 0.94, calcium 9.5, albumen 4.3, protein 7.2, and normal liver function tests.  Symptomatically, she is doing well.  She denies any complaint.   Past Medical History  Diagnosis Date  . Cancer     Multiple Myeloma  . Diabetes mellitus without complication   . Hypertension     Past Surgical History  Procedure Laterality Date  . Tubal ligation Left     Pt believes in was 1996  . Limbal stem cell transplant  2015    Family History  Problem Relation Age of Onset  . Cancer Mother   . Hypertension Mother   . Cancer Sister   . Diabetes Sister   . Crohn's disease Sister     Social History:  reports that she has never smoked. She does not have any smokeless tobacco history on file. She reports that she does not drink alcohol or use illicit drugs.  The patient is alone today  Allergies:  Allergies  Allergen Reactions  . Bee Venom Swelling    Current Medications: Current Outpatient Prescriptions  Medication Sig Dispense Refill  . amLODipine (NORVASC) 5 MG tablet Take 5 mg by mouth daily.    . ONE TOUCH ULTRA TEST test strip     . ONETOUCH DELICA LANCETS 12R MISC     . valACYclovir (VALTREX) 500 MG tablet Take 1 tablet (500 mg total) by mouth daily. 90  tablet 3   No current facility-administered medications for this visit.   Facility-Administered Medications Ordered in Other Visits  Medication Dose Route Frequency Provider Last Rate Last Dose  . bortezomib SQ (VELCADE) chemo injection 2.5 mg  1.3 mg/m2 (Order-Specific) Subcutaneous Once Lequita Asal, MD   2.5 mg at 01/14/15 1458   Review of Systems:  GENERAL:  Feels fine.  No fevers, sweats or weight loss. PERFORMANCE STATUS (ECOG):  0 HEENT:  No visual changes, runny nose, sore throat, mouth sores or tenderness. Lungs: No shortness of breath or cough.  No hemoptysis. Cardiac:  No chest pain, palpitations, orthopnea, or PND. GI:  No nausea, vomiting, diarrhea, constipation, melena or hematochezia. GU:  No urgency, frequency, dysuria, or hematuria. Musculoskeletal:  No back pain.  Intermittent right hip pain.  No muscle tenderness. Extremities:  No pain or swelling. Skin:  No rashes or skin changes. Neuro:  No headache, numbness or weakness, balance or coordination issues. Endocrine:  No diabetes, thyroid issues, hot flashes or night sweats. Psych:  No mood changes, depression or anxiety. Pain:  No focal pain. Review of systems:  All other systems reviewed and found to be negative.  Physical Exam: Blood pressure 130/88, pulse 90, temperature 97.8 F (36.6 C), temperature source Tympanic, resp. rate 18, height '5\' 1"'  (1.549 m), weight 192 lb 5.6 oz (87.25 kg). GENERAL:  Well developed,  well nourished, sitting comfortably in the exam room in no acute distress. MENTAL STATUS:  Alert and oriented to person, place and time. HEAD:  Normocephalic, atraumatic, face symmetric, no Cushingoid features. EYES:  Brown eyes.  Pupils equal round and reactive to light and accomodation.  No conjunctivitis or scleral icterus. ENT:  Oropharynx clear without lesion.  Tongue normal. Mucous membranes moist.  RESPIRATORY:  Clear to auscultation without rales, wheezes or rhonchi. CARDIOVASCULAR:   Regular rate and rhythm without murmur, rub or gallop. ABDOMEN:  Soft, non-tender, with active bowel sounds, and no hepatosplenomegaly.  No masses. SKIN:  No rashes, ulcers or lesions. EXTREMITIES: No edema, no skin discoloration or tenderness.  No palpable cords. LYMPH NODES: No palpable cervical, supraclavicular, axillary or inguinal adenopathy  NEUROLOGICAL: Unremarkable. PSYCH:  Appropriate.  Assessment:  Annette Jimenez is a 54 y.o. female with stage III IgG lambda multiple myeloma. She presented with acute back pain and compression fracture of T2 on 02/02/2013. SPEP revealed 1.9 g/dL IgG lambda monoclonal protein. Kappa free light chains were 11.3 and lambda free light chains were 42.68. Bone survey on 02/26/2013 revealed L2 lytic lesion and C6 inferior endplate subtle concave compression deformity. Bone marrow biopsy revealed 11% plasma cells by aspirate and 30% plasma cells by CD138. Cytogenetics were normal. FISH showed FGFR3/IGH translocation t(4;14), monosomy 13, and gain of 1q21.   She received radiation to T2. She received RVD chemotherapy. She underwent autologous stem cell transplant on 12/10/2013. Following transplant in 02/2014, she began Velcade every 2 weeks for maintenance therapy. She receives Zometa monthly (last 03/25/2015). SPEP revealed no monoclonal protein on 01/26/2015 and 04/18/2015. Light chains were normal on 03/22/2015 and 04/18/2015.  She has received immunizations post transplant.  She received her second round of [place], [place], and ActHIB on 04/08/2015.  She feels well.  Exam is unremarkable.  Labs are normal.  Plan: 1. Review labs from [facility]. 2. Velcade and Zometa today. 3. Complete [facility] slips for next 2 visits.  4. Discuss plan for last set of immunizations in 1 month. 5. RTC in 2 weeks for review of labs and Velcade. 6. RTC in 4 weeks for MD assessment , review of labs, Velcade, Zometa, and immunizations.   Lequita Asal,  MD  05/20/2015

## 2015-05-20 NOTE — Progress Notes (Signed)
Patient is here for follow-up of multiple myeloma and zometa/velcade treatment. Patient states that she has been doing well and offers no complaints today.

## 2015-05-22 ENCOUNTER — Encounter: Payer: Self-pay | Admitting: Hematology and Oncology

## 2015-06-01 ENCOUNTER — Encounter: Payer: Self-pay | Admitting: Hematology and Oncology

## 2015-06-03 ENCOUNTER — Other Ambulatory Visit: Payer: Self-pay | Admitting: Family Medicine

## 2015-06-03 ENCOUNTER — Ambulatory Visit: Payer: 59 | Admitting: Hematology and Oncology

## 2015-06-03 ENCOUNTER — Inpatient Hospital Stay: Payer: 59 | Attending: Hematology and Oncology

## 2015-06-03 ENCOUNTER — Encounter: Payer: Self-pay | Admitting: Internal Medicine

## 2015-06-03 DIAGNOSIS — C9002 Multiple myeloma in relapse: Secondary | ICD-10-CM

## 2015-06-03 DIAGNOSIS — Z23 Encounter for immunization: Secondary | ICD-10-CM | POA: Insufficient documentation

## 2015-06-03 DIAGNOSIS — Z5111 Encounter for antineoplastic chemotherapy: Secondary | ICD-10-CM | POA: Insufficient documentation

## 2015-06-03 MED ORDER — ONDANSETRON HCL 4 MG PO TABS
4.0000 mg | ORAL_TABLET | Freq: Once | ORAL | Status: AC
  Administered 2015-06-03: 4 mg via ORAL
  Filled 2015-06-03: qty 1

## 2015-06-03 MED ORDER — BORTEZOMIB CHEMO SQ INJECTION 3.5 MG (2.5MG/ML)
1.3000 mg/m2 | Freq: Once | INTRAMUSCULAR | Status: AC
  Administered 2015-06-03: 2.5 mg via SUBCUTANEOUS
  Filled 2015-06-03: qty 2.5

## 2015-06-16 ENCOUNTER — Encounter: Payer: Self-pay | Admitting: Hematology and Oncology

## 2015-06-17 ENCOUNTER — Other Ambulatory Visit: Payer: Self-pay | Admitting: *Deleted

## 2015-06-17 ENCOUNTER — Other Ambulatory Visit: Payer: Self-pay | Admitting: Hematology and Oncology

## 2015-06-17 ENCOUNTER — Ambulatory Visit: Payer: 59 | Admitting: Hematology and Oncology

## 2015-06-17 ENCOUNTER — Inpatient Hospital Stay: Payer: 59

## 2015-06-17 ENCOUNTER — Inpatient Hospital Stay: Payer: 59 | Admitting: Internal Medicine

## 2015-06-17 ENCOUNTER — Encounter: Payer: Self-pay | Admitting: Internal Medicine

## 2015-06-17 VITALS — BP 124/89 | HR 91 | Temp 98.3°F | Resp 18 | Ht 61.0 in | Wt 193.6 lb

## 2015-06-17 DIAGNOSIS — C9001 Multiple myeloma in remission: Secondary | ICD-10-CM

## 2015-06-17 DIAGNOSIS — C9002 Multiple myeloma in relapse: Secondary | ICD-10-CM

## 2015-06-17 MED ORDER — HAEMOPHILUS B POLYSAC CONJ VAC IM SOLR
0.5000 mL | Freq: Once | INTRAMUSCULAR | Status: DC
  Filled 2015-06-17: qty 0.5

## 2015-06-17 MED ORDER — HAEMOPHILUS B POLYSAC CONJ VAC 7.5 MCG/0.5 ML IM SUSP
0.5000 mL | Freq: Once | INTRAMUSCULAR | Status: AC
  Administered 2015-06-17: 0.5 mL via INTRAMUSCULAR
  Filled 2015-06-17: qty 0.5

## 2015-06-17 MED ORDER — ZOLEDRONIC ACID 4 MG/100ML IV SOLN
4.0000 mg | Freq: Once | INTRAVENOUS | Status: AC
  Administered 2015-06-17: 4 mg via INTRAVENOUS
  Filled 2015-06-17: qty 100

## 2015-06-17 MED ORDER — PNEUMOCOCCAL 13-VAL CONJ VACC IM SUSP
0.5000 mL | Freq: Once | INTRAMUSCULAR | Status: AC
  Administered 2015-06-17: 0.5 mL via INTRAMUSCULAR
  Filled 2015-06-17: qty 0.5

## 2015-06-17 MED ORDER — ONDANSETRON HCL 4 MG PO TABS
4.0000 mg | ORAL_TABLET | Freq: Once | ORAL | Status: AC
  Administered 2015-06-17: 4 mg via ORAL
  Filled 2015-06-17: qty 1

## 2015-06-17 MED ORDER — DTAP-HEPATITIS B RECOMB-IPV IM SUSP
0.5000 mL | Freq: Once | INTRAMUSCULAR | Status: AC
  Administered 2015-06-17: 0.5 mL via INTRAMUSCULAR
  Filled 2015-06-17: qty 0.5

## 2015-06-17 MED ORDER — BORTEZOMIB CHEMO SQ INJECTION 3.5 MG (2.5MG/ML)
1.3000 mg/m2 | Freq: Once | INTRAMUSCULAR | Status: AC
  Administered 2015-06-17: 2.5 mg via SUBCUTANEOUS
  Filled 2015-06-17: qty 1

## 2015-06-17 NOTE — Progress Notes (Unsigned)
[facility] day:  05/20/2015  Chief Complaint: Annette Jimenez is an 54 y.o. female with multiple myeloma status post autologous stem cell transplant who is seen for assessment prior to every other week Velcade and monthly Zometa.  HPI:  Annette Jimenez returns to our clinic for follow-up visit. She has done well since her previous appointment. She continues to receive maintenance therapy with biweekly Velcade, which she has been able to tolerate very well. Specifically, she denies nausea, vomiting, diarrhea, abdominal pain, lower extremity swelling, shortness of breath, numbness, tingling, burning, pain of palms or soles. Her energy level is good, her appetite is satisfactory.   Past Medical History  Diagnosis Date  . Cancer [facility])     Multiple Myeloma  . Hypertension   . Diabetes mellitus without complication ([place])     pre diabetic    Past Surgical History  Procedure Laterality Date  . Tubal ligation Left     Pt believes in was 1996  . Limbal stem cell transplant  2015    Family History  Problem Relation Age of Onset  . Cancer Mother   . Hypertension Mother   . Cancer Sister   . Diabetes Sister   . Crohn's disease Sister     Social History:  reports that she has never smoked. She has never used smokeless tobacco. She reports that she does not drink alcohol or use illicit drugs.  The patient is alone today  Allergies:  Allergies  Allergen Reactions  . Bee Venom Swelling    Current Medications: Current Outpatient Prescriptions  Medication Sig Dispense Refill  . amLODipine (NORVASC) 5 MG tablet Take 5 mg by mouth daily.    . ONE TOUCH ULTRA TEST test strip     . ONETOUCH DELICA LANCETS 81X MISC     . valACYclovir (VALTREX) 500 MG tablet Take 1 tablet (500 mg total) by mouth daily. 90 tablet 3   No current facility-administered medications for this visit.   Facility-Administered Medications Ordered in Other Visits   Medication Dose Route Frequency Provider Last Rate Last Dose  . bortezomib SQ (VELCADE) chemo injection 2.5 mg  1.3 mg/m2 (Order-Specific) Subcutaneous Once Lequita Asal, MD   2.5 mg at 01/14/15 1458  . DTaP-hepatitis B recombinant-IPV (PEDIARIX) injection 0.5 mL  0.5 mL Intramuscular Once Lequita Asal, MD      . haemophilus B conjugate vaccine (PEDVAX HIB) injection 0.5 mL  0.5 mL Intramuscular Once Yetunde Leis, MD      . haemophilus B polysaccharide conjugate vaccine (ActHIB) injection 0.5 mL  0.5 mL Intramuscular Once Lequita Asal, MD      . pneumococcal 13-valent conjugate vaccine (PREVNAR 13) injection 0.5 mL  0.5 mL Intramuscular Once Lequita Asal, MD       Review of Systems:  GENERAL:  Feels fine.  No fevers, sweats or weight loss. PERFORMANCE STATUS (ECOG):  0 HEENT:  No visual changes, runny nose, sore throat, mouth sores or tenderness. Lungs: No shortness of breath or cough.  No hemoptysis. Cardiac:  No chest pain, palpitations, orthopnea, or PND. GI:  No nausea, vomiting, diarrhea, constipation, melena or hematochezia. GU:  No urgency, frequency, dysuria, or hematuria. Musculoskeletal:  No back pain.  Intermittent right hip pain.  No muscle tenderness. Extremities:  No pain or swelling. Skin:  No rashes or skin changes. Neuro:  No headache, numbness or weakness, balance or coordination issues. Endocrine:  No diabetes, thyroid issues, hot flashes  or night sweats. Psych:  No mood changes, depression or anxiety. Pain:  No focal pain. Review of systems:  All other systems reviewed and found to be negative.  LABS: CBC performed at [facility] 06/16/2015: White blood cell count 5.6, red blood cell count 3.48, hemoglobin 11.5, hematocrit 33.1, MCV 95, platelet count 252, absolute neutrophil count 2.9, absolute lymphocyte count 2.1, absolute monocyte count 0.4. CMP performed on the same date: Glucose 127, BUN 14, creatinine 0.78, sodium 142, potassium 4.7,  chloride 102, bicarbonate 24, calcium 9.6, albumin 4.1, total bilirubin less than 0.2, alkaline phosphatase 65, AST 15, ALT 13. Serum protein electrophoresis pending Serum free light chain assay pending  Physical Exam: Blood pressure 124/89, pulse 91, temperature 98.3 F (36.8 C), temperature source Tympanic, resp. rate 18, height _0  (1.549 m), weight 193 lb 9 oz (87.8 kg). GENERAL:  Well developed, well nourished, sitting comfortably in the exam room in no acute distress. MENTAL STATUS:  Alert and oriented to person, place and time. HEAD:  Normocephalic, atraumatic, face symmetric, no Cushingoid features. EYES:  Brown eyes.  Pupils equal round and reactive to light and accomodation.  No conjunctivitis or scleral icterus. ENT:  Oropharynx clear without lesion.  Tongue normal. Mucous membranes moist.  RESPIRATORY:  Clear to auscultation without rales, wheezes or rhonchi. CARDIOVASCULAR:  Regular rate and rhythm without murmur, rub or gallop. ABDOMEN:  Soft, non-tender, with active bowel sounds, and no hepatosplenomegaly.  No masses. SKIN:  No rashes, ulcers or lesions. EXTREMITIES: No edema, no skin discoloration or tenderness.  No palpable cords. LYMPH NODES: No palpable cervical, supraclavicular, axillary or inguinal adenopathy  NEUROLOGICAL: Unremarkable. PSYCH:  Appropriate.  Assessment:  Annette Jimenez is a 54 y.o. female with stage III IgG lambda multiple myeloma. She presented with acute back pain and compression fracture of T2 on 02/02/2013. SPEP revealed 1.9 g/dL IgG lambda monoclonal protein. Kappa free light chains were 11.3 and lambda free light chains were 42.68. Bone survey on 02/26/2013 revealed L2 lytic lesion and C6 inferior endplate subtle concave compression deformity. Bone marrow biopsy revealed 11% plasma cells by aspirate and 30% plasma cells by CD138. Cytogenetics were normal. FISH showed FGFR3/IGH translocation t(4;14), monosomy 13, and gain of 1q21.   She  received radiation to T2. She received RVD chemotherapy. She underwent autologous stem cell transplant on 12/10/2013. Following transplant in 02/2014, she began Velcade every 2 weeks for maintenance therapy. SPEP revealed no monoclonal protein on 01/26/2015 and 04/18/2015. Light chains were normal on 03/22/2015 and 04/18/2015.  She appears to be in complete response right now. We will continue with current regimen of Velcade 1.7 mg/m 2 weeks on 2 weeks off until the progression of the disease or intolerance. So far she has been able to tolerate treatment remarkably well. She should continue acyclovir as VZV prophylaxis for the duration of treatment with Velcade.  She has received immunizations post transplant.  She received her second round of [place], [place], and ActHIB on 04/08/2015. She will complete immunization with DTaP, Prevnar 13 and Haemophilus influenza B vaccine today.  Bisphosphonates-she will need to continue Zometa to complete 2 years post transplant. She will have Zometa administered today.   Plan: 1. Review labs from [facility]. 2. Velcade and Zometa today. 3. Complete [facility] slips for next 2 visits.  4. RTC in 2 weeks for review of labs and Velcade. 5. RTC in 4 weeks for MD assessment , review of labs, Velcade, Zometa.Roxana Hires, MD  06/17/2015 3:10 PM

## 2015-06-29 ENCOUNTER — Encounter: Payer: Self-pay | Admitting: Hematology and Oncology

## 2015-07-01 ENCOUNTER — Other Ambulatory Visit: Payer: Self-pay | Admitting: Hematology and Oncology

## 2015-07-01 ENCOUNTER — Inpatient Hospital Stay: Payer: 59 | Attending: Hematology and Oncology

## 2015-07-01 DIAGNOSIS — C9002 Multiple myeloma in relapse: Secondary | ICD-10-CM | POA: Insufficient documentation

## 2015-07-01 DIAGNOSIS — Z87440 Personal history of urinary (tract) infections: Secondary | ICD-10-CM | POA: Diagnosis not present

## 2015-07-01 DIAGNOSIS — Z809 Family history of malignant neoplasm, unspecified: Secondary | ICD-10-CM | POA: Insufficient documentation

## 2015-07-01 DIAGNOSIS — Z923 Personal history of irradiation: Secondary | ICD-10-CM | POA: Diagnosis not present

## 2015-07-01 DIAGNOSIS — R200 Anesthesia of skin: Secondary | ICD-10-CM | POA: Insufficient documentation

## 2015-07-01 DIAGNOSIS — Z9484 Stem cells transplant status: Secondary | ICD-10-CM | POA: Insufficient documentation

## 2015-07-01 DIAGNOSIS — I10 Essential (primary) hypertension: Secondary | ICD-10-CM | POA: Diagnosis not present

## 2015-07-01 DIAGNOSIS — Z79899 Other long term (current) drug therapy: Secondary | ICD-10-CM | POA: Insufficient documentation

## 2015-07-01 DIAGNOSIS — E119 Type 2 diabetes mellitus without complications: Secondary | ICD-10-CM | POA: Diagnosis not present

## 2015-07-01 DIAGNOSIS — Z5111 Encounter for antineoplastic chemotherapy: Secondary | ICD-10-CM | POA: Diagnosis not present

## 2015-07-01 MED ORDER — ONDANSETRON HCL 4 MG PO TABS
4.0000 mg | ORAL_TABLET | Freq: Once | ORAL | Status: AC
  Administered 2015-07-01: 4 mg via ORAL
  Filled 2015-07-01: qty 1

## 2015-07-01 MED ORDER — BORTEZOMIB CHEMO SQ INJECTION 3.5 MG (2.5MG/ML)
1.3000 mg/m2 | Freq: Once | INTRAMUSCULAR | Status: AC
  Administered 2015-07-01: 2.5 mg via SUBCUTANEOUS
  Filled 2015-07-01: qty 2.5

## 2015-07-13 ENCOUNTER — Encounter: Payer: Self-pay | Admitting: Hematology and Oncology

## 2015-07-14 ENCOUNTER — Other Ambulatory Visit: Payer: Self-pay | Admitting: *Deleted

## 2015-07-15 ENCOUNTER — Inpatient Hospital Stay (HOSPITAL_BASED_OUTPATIENT_CLINIC_OR_DEPARTMENT_OTHER): Payer: 59 | Admitting: Hematology and Oncology

## 2015-07-15 ENCOUNTER — Other Ambulatory Visit: Payer: Self-pay | Admitting: Hematology and Oncology

## 2015-07-15 ENCOUNTER — Encounter: Payer: Self-pay | Admitting: Hematology and Oncology

## 2015-07-15 ENCOUNTER — Inpatient Hospital Stay: Payer: 59

## 2015-07-15 VITALS — BP 132/90 | HR 96 | Temp 97.7°F | Resp 18 | Ht 61.0 in | Wt 194.0 lb

## 2015-07-15 DIAGNOSIS — Z87440 Personal history of urinary (tract) infections: Secondary | ICD-10-CM

## 2015-07-15 DIAGNOSIS — Z79899 Other long term (current) drug therapy: Secondary | ICD-10-CM | POA: Diagnosis not present

## 2015-07-15 DIAGNOSIS — E119 Type 2 diabetes mellitus without complications: Secondary | ICD-10-CM

## 2015-07-15 DIAGNOSIS — C9002 Multiple myeloma in relapse: Secondary | ICD-10-CM

## 2015-07-15 DIAGNOSIS — Z923 Personal history of irradiation: Secondary | ICD-10-CM

## 2015-07-15 DIAGNOSIS — R200 Anesthesia of skin: Secondary | ICD-10-CM | POA: Diagnosis not present

## 2015-07-15 DIAGNOSIS — I10 Essential (primary) hypertension: Secondary | ICD-10-CM | POA: Diagnosis not present

## 2015-07-15 DIAGNOSIS — Z9484 Stem cells transplant status: Secondary | ICD-10-CM

## 2015-07-15 DIAGNOSIS — C9001 Multiple myeloma in remission: Secondary | ICD-10-CM

## 2015-07-15 DIAGNOSIS — Z809 Family history of malignant neoplasm, unspecified: Secondary | ICD-10-CM

## 2015-07-15 MED ORDER — ZOLEDRONIC ACID 4 MG/100ML IV SOLN
4.0000 mg | Freq: Once | INTRAVENOUS | Status: AC
  Administered 2015-07-15: 4 mg via INTRAVENOUS
  Filled 2015-07-15: qty 100

## 2015-07-15 MED ORDER — BORTEZOMIB CHEMO SQ INJECTION 3.5 MG (2.5MG/ML)
1.3000 mg/m2 | Freq: Once | INTRAMUSCULAR | Status: AC
  Administered 2015-07-15: 2.5 mg via SUBCUTANEOUS
  Filled 2015-07-15: qty 1

## 2015-07-15 MED ORDER — SODIUM CHLORIDE 0.9 % IV SOLN
INTRAVENOUS | Status: AC
  Administered 2015-07-15: 15:00:00 via INTRAVENOUS
  Filled 2015-07-15: qty 1000

## 2015-07-15 MED ORDER — ZOLEDRONIC ACID 4 MG/5ML IV CONC: 4.0000 mg | Freq: Once | INTRAVENOUS | Status: DC

## 2015-07-15 MED ORDER — ONDANSETRON HCL 4 MG PO TABS
4.0000 mg | ORAL_TABLET | Freq: Once | ORAL | Status: AC
  Administered 2015-07-15: 4 mg via ORAL
  Filled 2015-07-15: qty 1

## 2015-07-15 NOTE — Progress Notes (Signed)
Pt reports no changes seen last seen

## 2015-07-15 NOTE — Progress Notes (Signed)
[facility] day:  07/15/2015   Chief Complaint: Annette Jimenez is an 54 y.o. female with multiple myeloma status post autologous stem cell transplant who is seen for assessment prior to every other week Velcade and monthly Zometa.  HPI: The patient was last seen in the medical oncology clinic by me on 05/20/2015.  At that time, she was doing well.  She denied any complaint.  She received Velcade and Zometa.  She was seen by Dr. Rudean Hitt in my absence on 06/17/2015.  She denied any complaints.  She received Velcade and Zometa.  In addition, she completed her immunizations (Pediarix, Prevnar 13, and PedVax HIB)  Labs on 07/13/2015 revealed a hematocrit 34.2, hemoglobin 11.6, MCV 95, platelets 293,000, white count 6200 with an ANC of 3200. Comprehensive metabolic panel included a creatinine of 0.84, calcium 9.8, albumen 4.6, protein 7.6, and normal liver function tests.  Symptomatically, she is doing well.  She had an interval UTI treated with ciprofloxacin.  She notes a little bit of tingling in her right great toe.  It does not affect her ability to walk.  She denies any numbness of tingling in her hands.   Past Medical History  Diagnosis Date  . Cancer [facility])     Multiple Myeloma  . Hypertension   . Diabetes mellitus without complication ([place])     pre diabetic    Past Surgical History  Procedure Laterality Date  . Tubal ligation Left     Pt believes in was 1996  . Limbal stem cell transplant  2015    Family History  Problem Relation Age of Onset  . Cancer Mother   . Hypertension Mother   . Cancer Sister   . Diabetes Sister   . Crohn's disease Sister     Social History:  reports that she has never smoked. She has never used smokeless tobacco. She reports that she does not drink alcohol or use illicit drugs.  The patient is alone today  Allergies:  Allergies  Allergen Reactions  . Bee Venom Swelling    Current  Medications: Current Outpatient Prescriptions  Medication Sig Dispense Refill  . amLODipine (NORVASC) 5 MG tablet Take 5 mg by mouth daily.    . bortezomib IV (VELCADE) 3.5 MG injection Inject as directed every fourteen (14) days.    . ONE TOUCH ULTRA TEST test strip     . ONETOUCH DELICA LANCETS 48O MISC     . valACYclovir (VALTREX) 500 MG tablet Take 1 tablet (500 mg total) by mouth daily. 90 tablet 3   No current facility-administered medications for this visit.   Facility-Administered Medications Ordered in Other Visits  Medication Dose Route Frequency Provider Last Rate Last Dose  . bortezomib SQ (VELCADE) chemo injection 2.5 mg  1.3 mg/m2 (Order-Specific) Subcutaneous Once Lequita Asal, MD   2.5 mg at 01/14/15 1458   Review of Systems:  GENERAL:  Feels good.  No fevers, sweats or weight loss. PERFORMANCE STATUS (ECOG):  0 HEENT:  No visual changes, runny nose, sore throat, mouth sores or tenderness. Lungs: No shortness of breath or cough.  No hemoptysis. Cardiac:  No chest pain, palpitations, orthopnea, or PND. GI:  No nausea, vomiting, diarrhea, constipation, melena or hematochezia. GU:  No urgency, frequency, dysuria, or hematuria. Musculoskeletal:  No back pain.  Intermittent right hip pain.  No muscle tenderness. Extremities:  No pain or swelling. Skin:  No rashes or skin changes. Neuro:  Little bit  of neuropathy in right great toe.  No headache, numbness or weakness, balance or coordination issues. Endocrine:  No diabetes, thyroid issues, hot flashes or night sweats. Psych:  No mood changes, depression or anxiety. Pain:  No focal pain. Review of systems:  All other systems reviewed and found to be negative.  Physical Exam: Blood pressure 132/90, pulse 96, temperature 97.7 F (36.5 C), temperature source Tympanic, resp. rate 18, height 5' 1" (1.549 m), weight 194 lb 0.1 oz (88 kg). GENERAL:  Well developed, well nourished, sitting comfortably in the exam room in no  acute distress. MENTAL STATUS:  Alert and oriented to person, place and time. HEAD:  Styled short brown hair.  Normocephalic, atraumatic, face symmetric, no Cushingoid features. EYES:  Brown eyes.  Pupils equal round and reactive to light and accomodation.  No conjunctivitis or scleral icterus. ENT:  Oropharynx clear without lesion.  Tongue normal. Mucous membranes moist.  RESPIRATORY:  Clear to auscultation without rales, wheezes or rhonchi. CARDIOVASCULAR:  Regular rate and rhythm without murmur, rub or gallop. ABDOMEN:  Soft, non-tender, with active bowel sounds, and no hepatosplenomegaly.  No masses. SKIN:  No rashes, ulcers or lesions. EXTREMITIES: No edema, no skin discoloration or tenderness.  No palpable cords. LYMPH NODES: No palpable cervical, supraclavicular, axillary or inguinal adenopathy  NEUROLOGICAL: Unremarkable. PSYCH:  Appropriate.  Assessment:  Annette Jimenez is a 54 y.o. female with stage III IgG lambda multiple myeloma. She presented with acute back pain and compression fracture of T2 on 02/02/2013. SPEP revealed 1.9 g/dL IgG lambda monoclonal protein. Kappa free light chains were 11.3 and lambda free light chains were 42.68. Bone survey on 02/26/2013 revealed L2 lytic lesion and C6 inferior endplate subtle concave compression deformity. Bone marrow biopsy revealed 11% plasma cells by aspirate and 30% plasma cells by CD138. Cytogenetics were normal. FISH showed FGFR3/IGH translocation t(4;14), monosomy 13, and gain of 1q21.   She received radiation to T2. She received RVD chemotherapy. She underwent autologous stem cell transplant on 12/10/2013. Following transplant in 02/2014, she began Velcade every 2 weeks for maintenance therapy. She receives Zometa monthly (last 03/25/2015). SPEP revealed no monoclonal protein on 01/26/2015 and 04/18/2015. Light chains were normal on 03/22/2015 and 04/18/2015.  She has received immunizations post transplant.  She received  her second and third round of [place], [place], and ActHIB on 04/08/2015 and 06/17/2015.  She feels well.  She has had a little tingling in her right great toe.  Exam is unremarkable.  Labs are normal.  Plan: 1.  Review labs from [facility]. 2.  Velcade and Zometa today. 3.  Complete [facility] slips for next 2 visits.  4.  RTC in 2 weeks for review of labs and Velcade. 5.  RTC in 4 weeks for MD assessment , review of labs, Velcade and Zometa.   Lequita Asal, MD  07/15/2015, 1:49 PM

## 2015-07-28 ENCOUNTER — Other Ambulatory Visit: Payer: Self-pay | Admitting: Hematology and Oncology

## 2015-07-29 ENCOUNTER — Inpatient Hospital Stay: Payer: 59

## 2015-07-29 VITALS — BP 109/73 | HR 84 | Temp 97.5°F | Resp 18

## 2015-07-29 DIAGNOSIS — C9002 Multiple myeloma in relapse: Secondary | ICD-10-CM | POA: Diagnosis not present

## 2015-07-29 MED ORDER — BORTEZOMIB CHEMO SQ INJECTION 3.5 MG (2.5MG/ML)
1.3000 mg/m2 | Freq: Once | INTRAMUSCULAR | Status: AC
  Administered 2015-07-29: 2.5 mg via SUBCUTANEOUS
  Filled 2015-07-29: qty 1

## 2015-07-29 MED ORDER — ONDANSETRON HCL 4 MG PO TABS
4.0000 mg | ORAL_TABLET | Freq: Once | ORAL | Status: AC
  Administered 2015-07-29: 4 mg via ORAL
  Filled 2015-07-29: qty 1

## 2015-08-12 ENCOUNTER — Other Ambulatory Visit: Payer: Self-pay | Admitting: Hematology and Oncology

## 2015-08-12 ENCOUNTER — Encounter: Payer: Self-pay | Admitting: Hematology and Oncology

## 2015-08-12 ENCOUNTER — Inpatient Hospital Stay: Payer: 59

## 2015-08-12 ENCOUNTER — Inpatient Hospital Stay: Payer: 59 | Attending: Hematology and Oncology | Admitting: Hematology and Oncology

## 2015-08-12 VITALS — BP 126/86 | HR 91 | Temp 97.9°F | Resp 18 | Ht 61.0 in | Wt 196.4 lb

## 2015-08-12 DIAGNOSIS — C9001 Multiple myeloma in remission: Secondary | ICD-10-CM

## 2015-08-12 DIAGNOSIS — Z79899 Other long term (current) drug therapy: Secondary | ICD-10-CM

## 2015-08-12 DIAGNOSIS — Z809 Family history of malignant neoplasm, unspecified: Secondary | ICD-10-CM

## 2015-08-12 DIAGNOSIS — E119 Type 2 diabetes mellitus without complications: Secondary | ICD-10-CM | POA: Diagnosis not present

## 2015-08-12 DIAGNOSIS — Z87440 Personal history of urinary (tract) infections: Secondary | ICD-10-CM | POA: Diagnosis not present

## 2015-08-12 DIAGNOSIS — I10 Essential (primary) hypertension: Secondary | ICD-10-CM

## 2015-08-12 DIAGNOSIS — R202 Paresthesia of skin: Secondary | ICD-10-CM | POA: Diagnosis not present

## 2015-08-12 DIAGNOSIS — Z9484 Stem cells transplant status: Secondary | ICD-10-CM | POA: Diagnosis not present

## 2015-08-12 DIAGNOSIS — C9000 Multiple myeloma not having achieved remission: Secondary | ICD-10-CM

## 2015-08-12 DIAGNOSIS — Z5111 Encounter for antineoplastic chemotherapy: Secondary | ICD-10-CM | POA: Insufficient documentation

## 2015-08-12 DIAGNOSIS — Z923 Personal history of irradiation: Secondary | ICD-10-CM | POA: Diagnosis not present

## 2015-08-12 DIAGNOSIS — C9002 Multiple myeloma in relapse: Secondary | ICD-10-CM

## 2015-08-12 MED ORDER — BORTEZOMIB CHEMO SQ INJECTION 3.5 MG (2.5MG/ML)
1.3000 mg/m2 | Freq: Once | INTRAMUSCULAR | Status: AC
  Administered 2015-08-12: 2.5 mg via SUBCUTANEOUS
  Filled 2015-08-12: qty 1

## 2015-08-12 MED ORDER — ZOLEDRONIC ACID 4 MG/5ML IV CONC: 4.0000 mg | Freq: Once | INTRAVENOUS | Status: DC

## 2015-08-12 MED ORDER — ZOLEDRONIC ACID 4 MG/100ML IV SOLN
4.0000 mg | Freq: Once | INTRAVENOUS | Status: AC
  Administered 2015-08-12: 4 mg via INTRAVENOUS

## 2015-08-12 MED ORDER — SODIUM CHLORIDE 0.9 % IV SOLN
Freq: Once | INTRAVENOUS | Status: DC
  Filled 2015-08-12: qty 1000

## 2015-08-12 MED ORDER — ONDANSETRON HCL 4 MG PO TABS
4.0000 mg | ORAL_TABLET | Freq: Once | ORAL | Status: AC
  Administered 2015-08-12: 4 mg via ORAL
  Filled 2015-08-12: qty 1

## 2015-08-12 NOTE — Progress Notes (Signed)
[facility] day:  08/12/2015   Chief Complaint: Annette Jimenez is an 55 y.o. female with multiple myeloma status post autologous stem cell transplant who is seen for assessment prior to every other week Velcade and monthly Zometa.  HPI: The patient was last seen in the medical oncology clinic by me on 07/15/2015.  At that time, she was doing well.  She had an interval UTI treated with ciprofloxacin.  She noted a little bit of tingling in her right great toe.  It did not affect her ability to walk.  She denied any numbness of tingling in her hands.  She received her Velcade and Zometa.  She then received Velcade on 07/29/2015.  [facility] labs on 08/10/2015 revealed a hematocrit 35.4, hemoglobin 11.8, MCV 94, platelets 278,000, white count 6600 with an ANC of 3100. Comprehensive metabolic panel included a creatinine of 0.92, calcium 9.8, protein 7.3, albumin 4.5, and normal liver function tests. Serum protein electrophoresis revealed no monoclonal protein.  During the interim, she has done well.  She voices no concerns.  She has had no further tingling/numbness.  She has had no infections.   Past Medical History  Diagnosis Date  . Cancer [facility])     Multiple Myeloma  . Hypertension   . Diabetes mellitus without complication ([place])     pre diabetic    Past Surgical History  Procedure Laterality Date  . Tubal ligation Left     Pt believes in was 1996  . Limbal stem cell transplant  2015    Family History  Problem Relation Age of Onset  . Cancer Mother   . Hypertension Mother   . Cancer Sister   . Diabetes Sister   . Crohn's disease Sister     Social History:  reports that she has never smoked. She has never used smokeless tobacco. She reports that she does not drink alcohol or use illicit drugs.  The patient is alone today  Allergies:  Allergies  Allergen Reactions  . Bee Venom Swelling    Current Medications: Current Outpatient  Prescriptions  Medication Sig Dispense Refill  . amLODipine (NORVASC) 5 MG tablet Take 5 mg by mouth daily.    . bortezomib IV (VELCADE) 3.5 MG injection Inject as directed every fourteen (14) days.    . ONE TOUCH ULTRA TEST test strip     . ONETOUCH DELICA LANCETS 00T MISC     . valACYclovir (VALTREX) 500 MG tablet Take 1 tablet (500 mg total) by mouth daily. 90 tablet 3   No current facility-administered medications for this visit.   Facility-Administered Medications Ordered in Other Visits  Medication Dose Route Frequency Provider Last Rate Last Dose  . 0.9 %  sodium chloride infusion   Intravenous Continuous Lequita Asal, MD   Stopped at 07/15/15 1507  . bortezomib SQ (VELCADE) chemo injection 2.5 mg  1.3 mg/m2 (Order-Specific) Subcutaneous Once Lequita Asal, MD   2.5 mg at 01/14/15 1458   Review of Systems:  GENERAL:  Feels good.  No fevers, sweats or weight loss. PERFORMANCE STATUS (ECOG):  0 HEENT:  No visual changes, runny nose, sore throat, mouth sores or tenderness. Lungs: No shortness of breath or cough.  No hemoptysis. Cardiac:  No chest pain, palpitations, orthopnea, or PND. GI:  No nausea, vomiting, diarrhea, constipation, melena or hematochezia. GU:  No urgency, frequency, dysuria, or hematuria. Musculoskeletal:  No back pain.  Intermittent right hip pain.  No muscle tenderness.  Extremities:  No pain or swelling. Skin:  No rashes or skin changes. Neuro:  No headache, numbness or weakness, balance or coordination issues. Endocrine:  No diabetes, thyroid issues, hot flashes or night sweats. Psych:  No mood changes, depression or anxiety. Pain:  No focal pain. Review of systems:  All other systems reviewed and found to be negative.  Physical Exam: Blood pressure 126/86, pulse 91, temperature 97.9 F (36.6 C), temperature source Tympanic, resp. rate 18, height '5\' 1"'  (1.549 m), weight 196 lb 6.9 oz (89.1 kg). GENERAL:  Well developed, well nourished, sitting  comfortably in the exam room in no acute distress. MENTAL STATUS:  Alert and oriented to person, place and time. HEAD:  Wearing a white cap.  Styled short brown hair.  Normocephalic, atraumatic, face symmetric, no Cushingoid features. EYES:  Brown eyes.  Pupils equal round and reactive to light and accomodation.  No conjunctivitis or scleral icterus. ENT:  Oropharynx clear without lesion.  Tongue normal. Mucous membranes moist.  RESPIRATORY:  Clear to auscultation without rales, wheezes or rhonchi. CARDIOVASCULAR:  Regular rate and rhythm without murmur, rub or gallop. ABDOMEN:  Soft, non-tender, with active bowel sounds, and no hepatosplenomegaly.  No masses. SKIN:  No rashes, ulcers or lesions. EXTREMITIES: No edema, no skin discoloration or tenderness.  No palpable cords. LYMPH NODES: No palpable cervical, supraclavicular, axillary or inguinal adenopathy  NEUROLOGICAL: Unremarkable.  Bilateral patellar reflexes 2+. PSYCH:  Appropriate.  Assessment:  Annette Jimenez is a 55 y.o. female with stage III IgG lambda multiple myeloma. She presented with acute back pain and compression fracture of T2 on 02/02/2013. SPEP revealed 1.9 g/dL IgG lambda monoclonal protein. Kappa free light chains were 11.3 and lambda free light chains were 42.68. Bone survey on 02/26/2013 revealed L2 lytic lesion and C6 inferior endplate subtle concave compression deformity. Bone marrow biopsy revealed 11% plasma cells by aspirate and 30% plasma cells by CD138. Cytogenetics were normal. FISH showed FGFR3/IGH translocation t(4;14), monosomy 13, and gain of 1q21.   She received radiation to T2. She received RVD chemotherapy. She underwent autologous stem cell transplant on 12/10/2013. Following transplant in 02/2014, she began Velcade every 2 weeks for maintenance therapy. She receives Zometa monthly (last 03/25/2015). SPEP revealed no monoclonal protein on 01/26/2015, 04/18/2015, and 08/10/2015. Light chains were  normal on 03/22/2015 and 04/18/2015.  She has received immunizations post transplant.  She received her second and third round of [place], [place], and ActHIB on 04/08/2015 and 06/17/2015.  She feels good.  Exam is unremarkable.  Labs are normal.  Plan: 1.  Review labs from [facility]. 2.  Velcade and Zometa today. 3.  Complete [facility] slips for next 2 visits.  4.  Discuss Zometa.  Current plan for 2 years with re-evaluation in 11/2015 at [facility] assessment.  Discuss dental check every 6 months. 5.  RTC in 2 weeks for review of labs and Velcade. 6.  RTC in 4 weeks for MD assessment , review of labs, Velcade and Zometa.   Lequita Asal, MD  08/12/2015, 2:35 PM

## 2015-08-24 ENCOUNTER — Encounter: Payer: Self-pay | Admitting: Hematology and Oncology

## 2015-08-26 ENCOUNTER — Inpatient Hospital Stay: Payer: 59

## 2015-08-26 ENCOUNTER — Other Ambulatory Visit: Payer: Self-pay | Admitting: Hematology and Oncology

## 2015-08-26 VITALS — BP 143/95 | HR 86 | Temp 96.8°F | Resp 20

## 2015-08-26 DIAGNOSIS — C9000 Multiple myeloma not having achieved remission: Secondary | ICD-10-CM | POA: Diagnosis not present

## 2015-08-26 DIAGNOSIS — C9002 Multiple myeloma in relapse: Secondary | ICD-10-CM

## 2015-08-26 MED ORDER — ONDANSETRON HCL 4 MG PO TABS
4.0000 mg | ORAL_TABLET | Freq: Once | ORAL | Status: AC
  Administered 2015-08-26: 4 mg via ORAL
  Filled 2015-08-26: qty 1

## 2015-08-26 MED ORDER — BORTEZOMIB CHEMO SQ INJECTION 3.5 MG (2.5MG/ML)
1.3000 mg/m2 | Freq: Once | INTRAMUSCULAR | Status: AC
  Administered 2015-08-26: 2.5 mg via SUBCUTANEOUS
  Filled 2015-08-26: qty 2.5

## 2015-09-07 ENCOUNTER — Encounter: Payer: Self-pay | Admitting: Hematology and Oncology

## 2015-09-09 ENCOUNTER — Other Ambulatory Visit: Payer: Self-pay | Admitting: Internal Medicine

## 2015-09-09 ENCOUNTER — Inpatient Hospital Stay: Payer: 59 | Attending: Internal Medicine

## 2015-09-09 DIAGNOSIS — Z9221 Personal history of antineoplastic chemotherapy: Secondary | ICD-10-CM | POA: Diagnosis not present

## 2015-09-09 DIAGNOSIS — Z79899 Other long term (current) drug therapy: Secondary | ICD-10-CM | POA: Insufficient documentation

## 2015-09-09 DIAGNOSIS — Z923 Personal history of irradiation: Secondary | ICD-10-CM | POA: Diagnosis not present

## 2015-09-09 DIAGNOSIS — Z5111 Encounter for antineoplastic chemotherapy: Secondary | ICD-10-CM | POA: Diagnosis not present

## 2015-09-09 DIAGNOSIS — C9002 Multiple myeloma in relapse: Secondary | ICD-10-CM | POA: Insufficient documentation

## 2015-09-09 DIAGNOSIS — I10 Essential (primary) hypertension: Secondary | ICD-10-CM | POA: Diagnosis not present

## 2015-09-09 DIAGNOSIS — Z9484 Stem cells transplant status: Secondary | ICD-10-CM | POA: Insufficient documentation

## 2015-09-09 MED ORDER — BORTEZOMIB CHEMO SQ INJECTION 3.5 MG (2.5MG/ML)
1.3000 mg/m2 | Freq: Once | INTRAMUSCULAR | Status: AC
Start: 2015-09-09 — End: 2015-09-09
  Administered 2015-09-09: 2.5 mg via SUBCUTANEOUS
  Filled 2015-09-09: qty 2.5

## 2015-09-09 MED ORDER — ONDANSETRON HCL 4 MG PO TABS
4.0000 mg | ORAL_TABLET | Freq: Once | ORAL | Status: AC
  Administered 2015-09-09: 4 mg via ORAL
  Filled 2015-09-09: qty 1

## 2015-09-16 ENCOUNTER — Ambulatory Visit: Payer: 59

## 2015-09-16 ENCOUNTER — Other Ambulatory Visit: Payer: Self-pay | Admitting: Hematology and Oncology

## 2015-09-16 ENCOUNTER — Ambulatory Visit: Payer: 59 | Admitting: Hematology and Oncology

## 2015-09-20 ENCOUNTER — Telehealth: Payer: Self-pay | Admitting: *Deleted

## 2015-09-20 NOTE — Telephone Encounter (Signed)
Called and left message with patient and took [facility] order form downstairs for patient to pick up.

## 2015-09-20 NOTE — Telephone Encounter (Signed)
Called to request a [facility] form for her upcoming lab draw

## 2015-09-21 ENCOUNTER — Encounter: Payer: Self-pay | Admitting: Hematology and Oncology

## 2015-09-23 ENCOUNTER — Inpatient Hospital Stay (HOSPITAL_BASED_OUTPATIENT_CLINIC_OR_DEPARTMENT_OTHER): Payer: 59 | Admitting: Hematology and Oncology

## 2015-09-23 ENCOUNTER — Other Ambulatory Visit: Payer: Self-pay | Admitting: Hematology and Oncology

## 2015-09-23 ENCOUNTER — Inpatient Hospital Stay: Payer: 59

## 2015-09-23 VITALS — BP 135/92 | HR 84 | Temp 97.6°F | Resp 18 | Wt 195.8 lb

## 2015-09-23 DIAGNOSIS — Z9484 Stem cells transplant status: Secondary | ICD-10-CM

## 2015-09-23 DIAGNOSIS — C9001 Multiple myeloma in remission: Secondary | ICD-10-CM

## 2015-09-23 DIAGNOSIS — Z923 Personal history of irradiation: Secondary | ICD-10-CM

## 2015-09-23 DIAGNOSIS — C9002 Multiple myeloma in relapse: Secondary | ICD-10-CM

## 2015-09-23 DIAGNOSIS — Z79899 Other long term (current) drug therapy: Secondary | ICD-10-CM | POA: Diagnosis not present

## 2015-09-23 DIAGNOSIS — I10 Essential (primary) hypertension: Secondary | ICD-10-CM

## 2015-09-23 DIAGNOSIS — Z9221 Personal history of antineoplastic chemotherapy: Secondary | ICD-10-CM

## 2015-09-23 MED ORDER — ZOLEDRONIC ACID 4 MG/5ML IV CONC: 4.0000 mg | Freq: Once | INTRAVENOUS | Status: DC

## 2015-09-23 MED ORDER — SODIUM CHLORIDE 0.9 % IV SOLN
Freq: Once | INTRAVENOUS | Status: DC
Start: 2015-09-23 — End: 2015-09-23
  Filled 2015-09-23: qty 1000

## 2015-09-23 MED ORDER — BORTEZOMIB CHEMO SQ INJECTION 3.5 MG (2.5MG/ML)
1.3000 mg/m2 | Freq: Once | INTRAMUSCULAR | Status: AC
  Administered 2015-09-23: 2.5 mg via SUBCUTANEOUS
  Filled 2015-09-23: qty 2.5

## 2015-09-23 MED ORDER — ZOLEDRONIC ACID 4 MG/100ML IV SOLN
4.0000 mg | Freq: Once | INTRAVENOUS | Status: AC
  Administered 2015-09-23: 4 mg via INTRAVENOUS
  Filled 2015-09-23: qty 100

## 2015-09-23 MED ORDER — ONDANSETRON HCL 4 MG PO TABS
4.0000 mg | ORAL_TABLET | Freq: Once | ORAL | Status: AC
  Administered 2015-09-23: 4 mg via ORAL
  Filled 2015-09-23: qty 1

## 2015-09-23 NOTE — Progress Notes (Signed)
Annette Jimenez day:  09/23/2015   Chief Complaint: Annette Jimenez is an 55 y.o. female with multiple myeloma status post autologous stem cell transplant who is seen for assessment prior to every other week Velcade and monthly Zometa.  HPI: The patient was last seen in the medical oncology Jimenez by me on 08/12/2015.  At that time, she was doing well.  She voiced no concerns.  She described no further tingling/numbness.  She had no interval infections.  She received Velcade on 08/12/2015, 08/26/2015, and 09/09/2015.  She received Zometa on 08/12/2015.  Symptomatically, she voices no concerns.  She feels good.  Past Medical History  Diagnosis Date  . Cancer [facility])     Multiple Myeloma  . Hypertension   . Diabetes mellitus without complication ([place])     pre diabetic    Past Surgical History  Procedure Laterality Date  . Tubal ligation Left     Pt believes in was 1996  . Limbal stem cell transplant  2015    Family History  Problem Relation Age of Onset  . Cancer Mother   . Hypertension Mother   . Cancer Sister   . Diabetes Sister   . Crohn's disease Sister     Social History:  reports that she has never smoked. She has never used smokeless tobacco. She reports that she does not drink alcohol or use illicit drugs.  The patient is alone today  Allergies:  Allergies  Allergen Reactions  . Bee Venom Swelling    Current Medications: Current Outpatient Prescriptions  Medication Sig Dispense Refill  . amLODipine (NORVASC) 5 MG tablet Take 5 mg by mouth daily.    . bortezomib IV (VELCADE) 3.5 MG injection Inject as directed every fourteen (14) days.    . ONE TOUCH ULTRA TEST test strip     . ONETOUCH DELICA LANCETS 19E MISC     . valACYclovir (VALTREX) 500 MG tablet Take 1 tablet (500 mg total) by mouth daily. 90 tablet 3   No current facility-administered medications for this visit.   Facility-Administered Medications Ordered  in Other Visits  Medication Dose Route Frequency Provider Last Rate Last Dose  . 0.9 %  sodium chloride infusion   Intravenous Continuous Lequita Asal, MD   Stopped at 07/15/15 1507  . 0.9 %  sodium chloride infusion   Intravenous Once Lequita Asal, MD      . bortezomib SQ (VELCADE) chemo injection 2.5 mg  1.3 mg/m2 (Order-Specific) Subcutaneous Once Lequita Asal, MD   2.5 mg at 01/14/15 1458   Review of Systems:  GENERAL:  Feels good.  No fevers, sweats or weight loss. PERFORMANCE STATUS (ECOG):  0 HEENT:  No visual changes, runny nose, sore throat, mouth sores or tenderness. Lungs: No shortness of breath or cough.  No hemoptysis. Cardiac:  No chest pain, palpitations, orthopnea, or PND. GI:  No nausea, vomiting, diarrhea, constipation, melena or hematochezia. GU:  No urgency, frequency, dysuria, or hematuria. Musculoskeletal:  No back pain.  No joint pain.  No muscle tenderness. Extremities:  No pain or swelling. Skin:  No rashes or skin changes. Neuro:  No headache, numbness or weakness, balance or coordination issues. Endocrine:  No diabetes, thyroid issues, hot flashes or night sweats. Psych:  No mood changes, depression or anxiety. Pain:  No focal pain. Review of systems:  All other systems reviewed and found to be negative.  Physical Exam: Blood pressure 135/92, pulse 84, temperature  97.6 F (36.4 C), resp. rate 18, weight 195 lb 12.3 oz (88.8 kg). GENERAL:  Well developed, well nourished, sitting comfortably in the exam room in no acute distress. MENTAL STATUS:  Alert and oriented to person, place and time. HEAD:  Styled short brown hair.  Normocephalic, atraumatic, face symmetric, no Cushingoid features. EYES:  Brown eyes.  Pupils equal round and reactive to light and accomodation.  No conjunctivitis or scleral icterus. ENT:  Oropharynx clear without lesion.  Tongue normal. Mucous membranes moist.  RESPIRATORY:  Clear to auscultation without rales, wheezes or  rhonchi. CARDIOVASCULAR:  Regular rate and rhythm without murmur, rub or gallop. ABDOMEN:  Soft, non-tender, with active bowel sounds, and no hepatosplenomegaly.  No masses. SKIN:  No rashes, ulcers or lesions. EXTREMITIES: No edema, no skin discoloration or tenderness.  No palpable cords. LYMPH NODES: No palpable cervical, supraclavicular, axillary or inguinal adenopathy  NEUROLOGICAL: Unremarkable.  Bilateral patellar reflexes 2+. PSYCH:  Appropriate.  [facility] Labs:  CBC with diff on 09/21/2015 revealed a hematocrit of 34.7, hemoglobin 11.8, MCV 93, platelets 300,000, WBC 6400 with an ANC of 3600.  Comprehensive metabolic panel included a creatinine of 0.83, calcium 9.8, albumen 4.7, and protein 7.6.  Liver function tests were normal.  SPEP revealed no monoclonal protein.  Assessment:  Annette Jimenez is a 55 y.o. female with stage III IgG lambda multiple myeloma. She presented with acute back pain and compression fracture of T2 on 02/02/2013. SPEP revealed 1.9 g/dL IgG lambda monoclonal protein. Kappa free light chains were 11.3 and lambda free light chains were 42.68. Bone survey on 02/26/2013 revealed L2 lytic lesion and C6 inferior endplate subtle concave compression deformity. Bone marrow biopsy revealed 11% plasma cells by aspirate and 30% plasma cells by CD138. Cytogenetics were normal. FISH showed FGFR3/IGH translocation t(4;14), monosomy 13, and gain of 1q21.   She received radiation to T2. She received RVD chemotherapy. She underwent autologous stem cell transplant on 12/10/2013. Following transplant in 02/2014, she began Velcade every 2 weeks for maintenance therapy. She receives Zometa monthly (last 03/25/2015). SPEP revealed no monoclonal protein on 01/26/2015, 04/18/2015, and 08/10/2015. Light chains were normal on 03/22/2015 and 04/18/2015.  She has received immunizations post transplant.  She received her second and third round of [place], [place], and ActHIB on  04/08/2015 and 06/17/2015.  She continues every 2 week maintenance Velcade.  She receives Zometa monthly.  Symptomatically, she feels good.  Exam is unremarkable.  Labs are normal.  Plan: 1.  Review labs from [facility]. 2.  Velcade and Zometa today. 3.  Complete [facility] slips for next 2 visits.  4.  Discuss Zometa.  Current plan for 2 years with re-evaluation in 11/2015 at [facility] BMT assessment.  Dental check every 6 months. 5.  RTC in 2 weeks for review of labs and Velcade. 6.  RTC in 4 weeks for MD assessment , review of labs, Velcade and Zometa.   Lequita Asal, MD  09/23/2015, 2:03 PM

## 2015-09-23 NOTE — Progress Notes (Signed)
Patient does not offer any problems today.  

## 2015-09-26 ENCOUNTER — Encounter: Payer: Self-pay | Admitting: Hematology and Oncology

## 2015-09-26 IMAGING — CR METASTATIC BONE SURVEY
1 series · 8 of 8 positions shown · non-contrast
Comparison: 02/26/2013

CLINICAL DATA: Multiple myeloma

EXAM:
METASTATIC BONE SURVEY

[Series 1: w chest pa · 0.14mm/px · 8 of 17 slices shown]
[im 1/17]
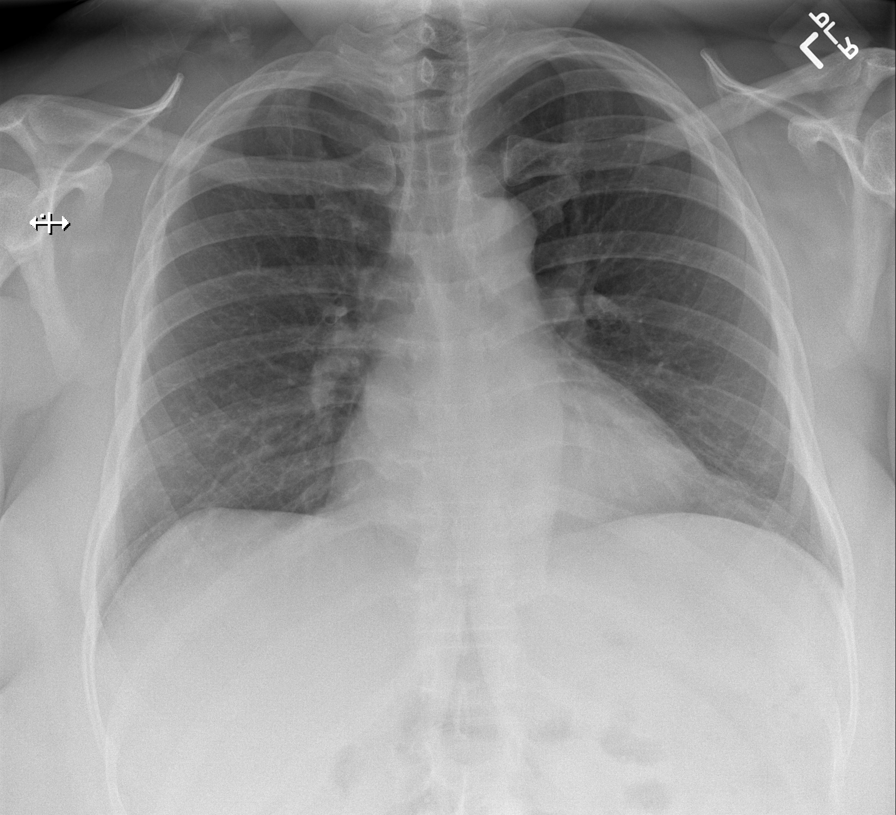
[im 3/17]
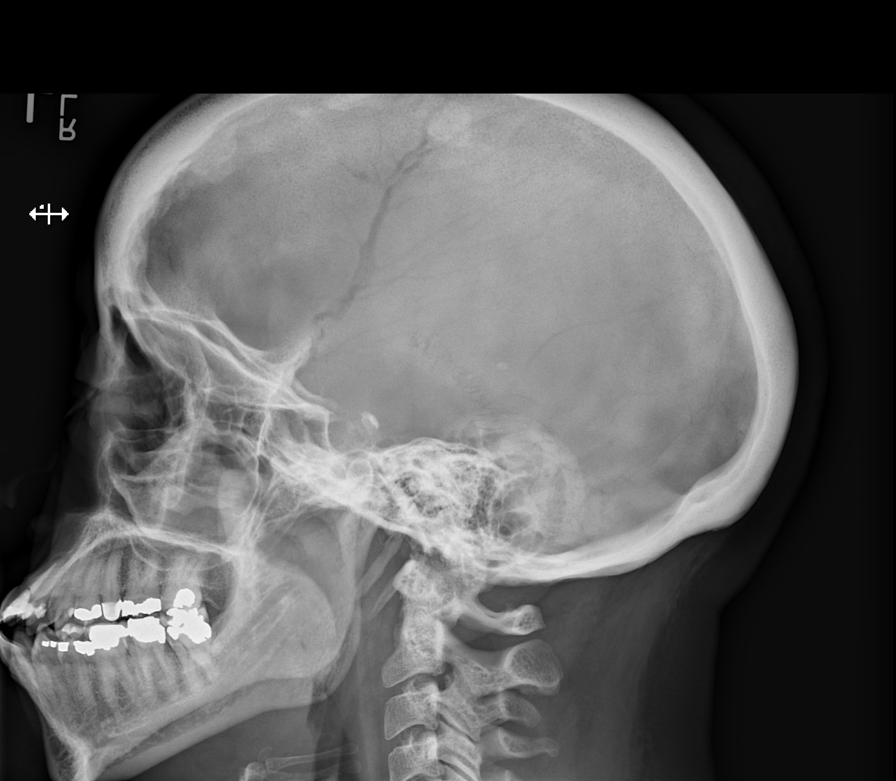
[im 5/17]
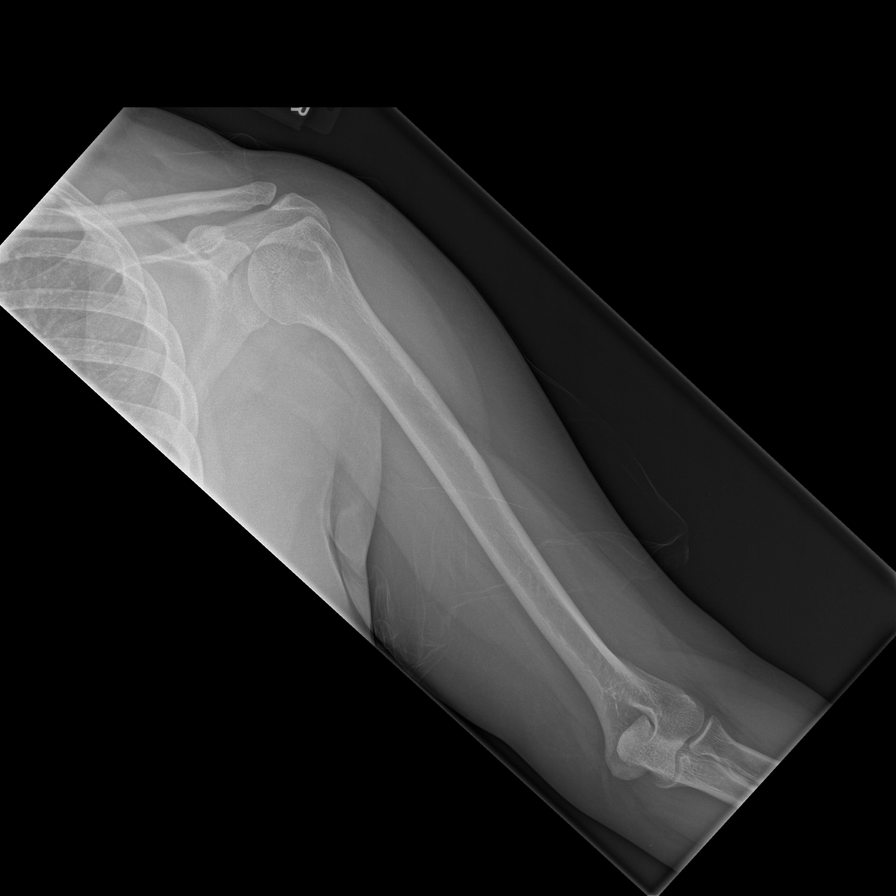
[im 7/17]
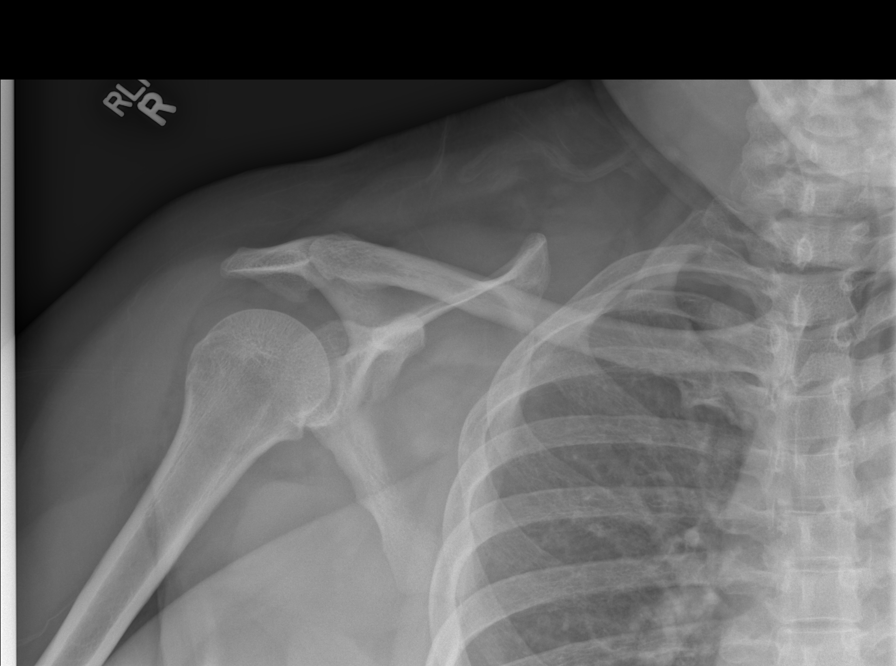
[im 10/17]
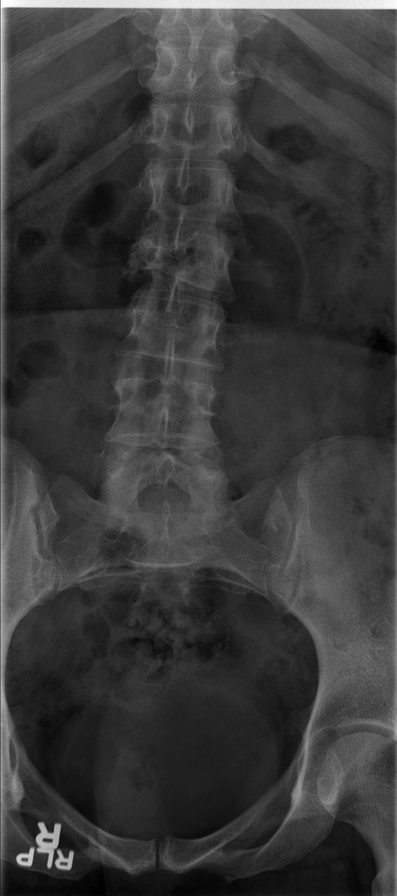
[im 12/17]
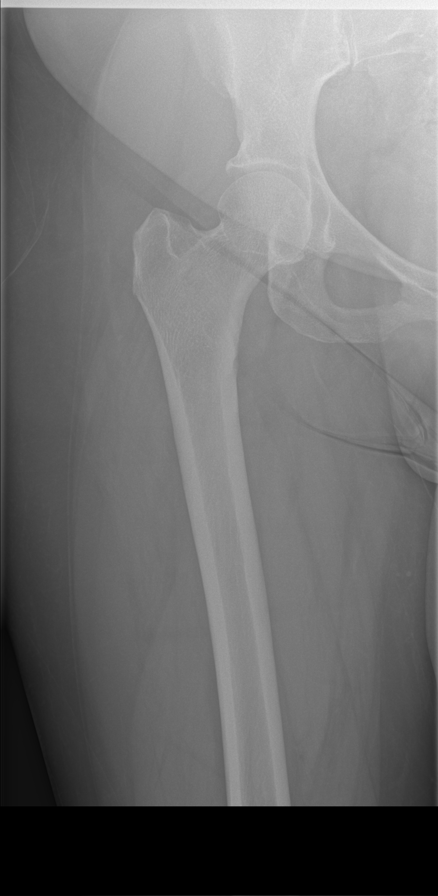
[im 14/17]
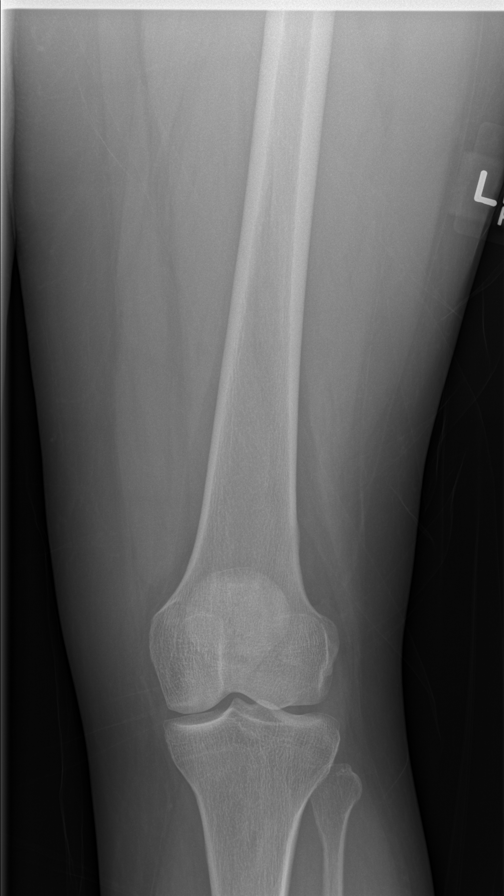
[im 17/17]
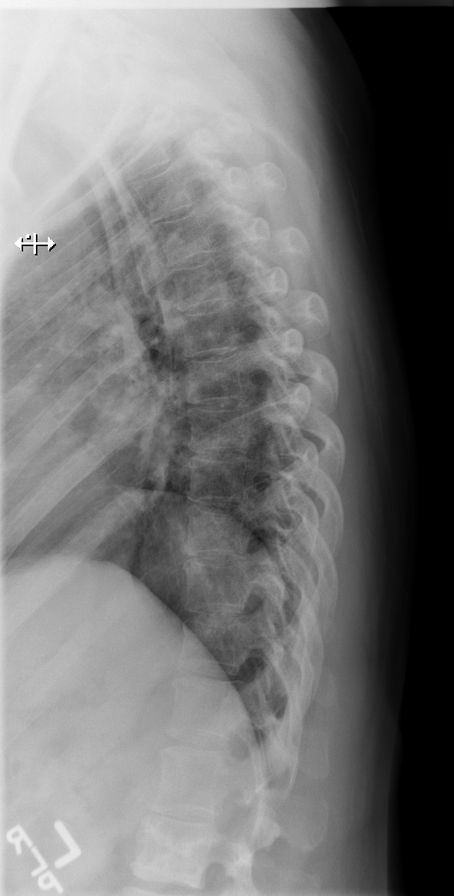

[8 of 8 positions shown; findings below may reference images not displayed]

FINDINGS: Enlargement of cardiac silhouette.

Mediastinal contours and pulmonary vascularity normal.

Minimal peribronchial thickening.

Lungs clear.

No pleural effusion or pneumothorax.

Osseous mineralization grossly normal.

Minimal hyperostosis frontalis interna.

Minimal scattered degenerative disc disease changes of the cervical
and thoracic spine.

Again identified abnormal appearance of the partial compression
deformity of the right lateral aspect of the L2 vertebral body,
similar to previous exam.

No lytic or destructive lesions are identified which are suggestive
of myelomatous involvement.
IMPRESSION: Chronic compression deformity of L2 vertebral body.

Mild enlargement of cardiac silhouette with minimal peribronchial
thickening.

No definite new lytic or destructive lesions are identified to
suggest multiple myeloma.

## 2015-09-28 ENCOUNTER — Ambulatory Visit
Admission: RE | Admit: 2015-09-28 | Discharge: 2015-09-28 | Disposition: A | Discharge status: Home / Self Care | Payer: 59 | Source: Ambulatory Visit | Attending: Hematology and Oncology | Admitting: Hematology and Oncology

## 2015-09-28 DIAGNOSIS — C9002 Multiple myeloma in relapse: Secondary | ICD-10-CM

## 2015-10-05 ENCOUNTER — Encounter: Payer: Self-pay | Admitting: Hematology and Oncology

## 2015-10-07 ENCOUNTER — Inpatient Hospital Stay: Payer: 59 | Attending: Hematology and Oncology

## 2015-10-07 ENCOUNTER — Other Ambulatory Visit: Payer: Self-pay | Admitting: Hematology and Oncology

## 2015-10-07 VITALS — BP 124/83 | HR 91 | Temp 97.3°F | Resp 18

## 2015-10-07 DIAGNOSIS — Z5111 Encounter for antineoplastic chemotherapy: Secondary | ICD-10-CM | POA: Diagnosis not present

## 2015-10-07 DIAGNOSIS — C9002 Multiple myeloma in relapse: Secondary | ICD-10-CM | POA: Diagnosis not present

## 2015-10-07 MED ORDER — BORTEZOMIB CHEMO SQ INJECTION 3.5 MG (2.5MG/ML)
1.3000 mg/m2 | Freq: Once | INTRAMUSCULAR | Status: AC
  Administered 2015-10-07: 2.5 mg via SUBCUTANEOUS
  Filled 2015-10-07: qty 2.5

## 2015-10-07 MED ORDER — ONDANSETRON HCL 4 MG PO TABS
4.0000 mg | ORAL_TABLET | Freq: Once | ORAL | Status: AC
Start: 2015-10-07 — End: 2015-10-07
  Administered 2015-10-07: 4 mg via ORAL
  Filled 2015-10-07: qty 1

## 2015-10-19 ENCOUNTER — Encounter: Payer: Self-pay | Admitting: Hematology and Oncology

## 2015-10-21 ENCOUNTER — Other Ambulatory Visit: Payer: Self-pay | Admitting: Internal Medicine

## 2015-10-21 ENCOUNTER — Inpatient Hospital Stay: Payer: 59

## 2015-10-21 VITALS — BP 142/88 | HR 97 | Temp 97.9°F | Resp 18

## 2015-10-21 DIAGNOSIS — C9001 Multiple myeloma in remission: Secondary | ICD-10-CM

## 2015-10-21 DIAGNOSIS — C9002 Multiple myeloma in relapse: Secondary | ICD-10-CM

## 2015-10-21 MED ORDER — SODIUM CHLORIDE 0.9 % IV SOLN
Freq: Once | INTRAVENOUS | Status: AC
  Administered 2015-10-21: 14:00:00 via INTRAVENOUS
  Filled 2015-10-21: qty 1000

## 2015-10-21 MED ORDER — ZOLEDRONIC ACID 4 MG/100ML IV SOLN
4.0000 mg | Freq: Once | INTRAVENOUS | Status: AC
  Administered 2015-10-21: 4 mg via INTRAVENOUS
  Filled 2015-10-21: qty 100

## 2015-10-21 MED ORDER — BORTEZOMIB CHEMO SQ INJECTION 3.5 MG (2.5MG/ML)
1.3000 mg/m2 | Freq: Once | INTRAMUSCULAR | Status: AC
  Administered 2015-10-21: 2.5 mg via SUBCUTANEOUS
  Filled 2015-10-21: qty 2.5

## 2015-10-21 MED ORDER — ONDANSETRON HCL 4 MG PO TABS
4.0000 mg | ORAL_TABLET | Freq: Once | ORAL | Status: AC
  Administered 2015-10-21: 4 mg via ORAL
  Filled 2015-10-21: qty 1

## 2015-10-24 ENCOUNTER — Ambulatory Visit: Payer: 59 | Admitting: Hematology and Oncology

## 2015-10-24 ENCOUNTER — Other Ambulatory Visit: Payer: 59

## 2015-10-24 ENCOUNTER — Ambulatory Visit: Payer: 59

## 2015-11-03 ENCOUNTER — Other Ambulatory Visit: Payer: Self-pay | Admitting: Hematology and Oncology

## 2015-11-03 ENCOUNTER — Inpatient Hospital Stay: Payer: 59

## 2015-11-03 ENCOUNTER — Encounter: Payer: Self-pay | Admitting: Hematology and Oncology

## 2015-11-03 ENCOUNTER — Other Ambulatory Visit: Payer: 59

## 2015-11-03 ENCOUNTER — Inpatient Hospital Stay: Payer: 59 | Admitting: Hematology and Oncology

## 2015-11-04 ENCOUNTER — Inpatient Hospital Stay: Payer: 59 | Attending: Hematology and Oncology | Admitting: Hematology and Oncology

## 2015-11-04 ENCOUNTER — Inpatient Hospital Stay: Payer: 59

## 2015-11-04 VITALS — BP 115/81 | HR 93 | Temp 98.3°F | Resp 18 | Wt 195.1 lb

## 2015-11-04 DIAGNOSIS — Z9484 Stem cells transplant status: Secondary | ICD-10-CM

## 2015-11-04 DIAGNOSIS — C9002 Multiple myeloma in relapse: Secondary | ICD-10-CM

## 2015-11-04 DIAGNOSIS — Z9221 Personal history of antineoplastic chemotherapy: Secondary | ICD-10-CM | POA: Insufficient documentation

## 2015-11-04 DIAGNOSIS — Z923 Personal history of irradiation: Secondary | ICD-10-CM | POA: Diagnosis not present

## 2015-11-04 DIAGNOSIS — I10 Essential (primary) hypertension: Secondary | ICD-10-CM | POA: Insufficient documentation

## 2015-11-04 DIAGNOSIS — C9000 Multiple myeloma not having achieved remission: Secondary | ICD-10-CM | POA: Diagnosis present

## 2015-11-04 DIAGNOSIS — Z5111 Encounter for antineoplastic chemotherapy: Secondary | ICD-10-CM | POA: Diagnosis not present

## 2015-11-04 DIAGNOSIS — Z79899 Other long term (current) drug therapy: Secondary | ICD-10-CM | POA: Insufficient documentation

## 2015-11-04 DIAGNOSIS — Z8781 Personal history of (healed) traumatic fracture: Secondary | ICD-10-CM | POA: Diagnosis not present

## 2015-11-04 DIAGNOSIS — E119 Type 2 diabetes mellitus without complications: Secondary | ICD-10-CM | POA: Insufficient documentation

## 2015-11-04 DIAGNOSIS — C9001 Multiple myeloma in remission: Secondary | ICD-10-CM

## 2015-11-04 MED ORDER — BORTEZOMIB CHEMO SQ INJECTION 3.5 MG (2.5MG/ML)
1.3000 mg/m2 | Freq: Once | INTRAMUSCULAR | Status: AC
  Administered 2015-11-04: 2.5 mg via SUBCUTANEOUS
  Filled 2015-11-04: qty 2.5

## 2015-11-04 MED ORDER — ONDANSETRON HCL 4 MG PO TABS
4.0000 mg | ORAL_TABLET | Freq: Once | ORAL | Status: AC
  Administered 2015-11-04: 4 mg via ORAL
  Filled 2015-11-04: qty 1

## 2015-11-04 NOTE — Progress Notes (Signed)
[place] [facility] [facility] day:  11/04/2015   Chief Complaint: Annette Jimenez is an 55 y.o. female with multiple myeloma status post autologous stem cell transplant who is seen for assessment prior to every other week Velcade.  HPI: The patient was last seen in the medical oncology clinic by me on 09/23/2015.  At that time, she voiced no concerns.  Labs were normal including a CBC with diff, CMP, SPEP.  She received Velcade and Zometa.  She received Velcade on 10/07/2015 and 10/21/2015.  She received Zometa on 09/23/2015.  She is scheduled to see the dentist on 11/15/2015.  She follow-up with [facility] on 12/15/2015.  Symptomatically, she denies any concerns.  She feels good.  Past Medical History  Diagnosis Date  . Cancer [facility])     Multiple Myeloma  . Hypertension   . Diabetes mellitus without complication ([place])     pre diabetic    Past Surgical History  Procedure Laterality Date  . Tubal ligation Left     Pt believes in was 1996  . Limbal stem cell transplant  2015    Family History  Problem Relation Age of Onset  . Cancer Mother   . Hypertension Mother   . Cancer Sister   . Diabetes Sister   . Crohn's disease Sister     Social History:  reports that she has never smoked. She has never used smokeless tobacco. She reports that she does not drink alcohol or use illicit drugs.  The patient is alone today  Allergies:  Allergies  Allergen Reactions  . Bee Venom Swelling    Current Medications: Current Outpatient Prescriptions  Medication Sig Dispense Refill  . amLODipine (NORVASC) 5 MG tablet Take 5 mg by mouth daily.    . valACYclovir (VALTREX) 500 MG tablet Take 1 tablet (500 mg total) by mouth daily. 90 tablet 3   No current facility-administered medications for this visit.   Facility-Administered Medications Ordered in Other Visits  Medication Dose Route Frequency Provider Last Rate Last Dose  . 0.9 %  sodium chloride infusion    Intravenous Continuous Lequita Asal, MD   Stopped at 07/15/15 1507  . 0.9 %  sodium chloride infusion   Intravenous Once Lequita Asal, MD      . bortezomib SQ (VELCADE) chemo injection 2.5 mg  1.3 mg/m2 (Order-Specific) Subcutaneous Once Lequita Asal, MD   2.5 mg at 01/14/15 1458   Review of Systems:  GENERAL:  Feels good.  No fevers, sweats or weight loss. PERFORMANCE STATUS (ECOG):  0 HEENT:  No visual changes, runny nose, sore throat, mouth sores or tenderness. Lungs: No shortness of breath or cough.  No hemoptysis. Cardiac:  No chest pain, palpitations, orthopnea, or PND. GI:  No nausea, vomiting, diarrhea, constipation, melena or hematochezia. GU:  No urgency, frequency, dysuria, or hematuria. Musculoskeletal:  No back pain.  No joint pain.  No muscle tenderness. Extremities:  No pain or swelling. Skin:  No rashes or skin changes. Neuro:  No headache, numbness or weakness, balance or coordination issues. Endocrine:  No diabetes, thyroid issues, hot flashes or night sweats. Psych:  No mood changes, depression or anxiety. Pain:  No focal pain. Review of systems:  All other systems reviewed and found to be negative.  Physical Exam: Blood pressure 115/81, pulse 93, temperature 98.3 F (36.8 C), temperature source Tympanic, resp. rate 18, weight 195 lb 1.7 oz (88.5 kg). GENERAL:  Well developed, well nourished, sitting comfortably  in the exam room in no acute distress. MENTAL STATUS:  Alert and oriented to person, place and time. HEAD:  Styled short brown hair.  Normocephalic, atraumatic, face symmetric, no Cushingoid features. EYES:  Brown eyes.  Pupils equal round and reactive to light and accomodation.  No conjunctivitis or scleral icterus. ENT:  Oropharynx clear without lesion.  Tongue normal. Mucous membranes moist.  RESPIRATORY:  Clear to auscultation without rales, wheezes or rhonchi. CARDIOVASCULAR:  Regular rate and rhythm without murmur, rub or  gallop. ABDOMEN:  Soft, non-tender, with active bowel sounds, and no hepatosplenomegaly.  No masses. SKIN:  No rashes, ulcers or lesions. EXTREMITIES: No edema, no skin discoloration or tenderness.  No palpable cords. LYMPH NODES: No palpable cervical, supraclavicular, axillary or inguinal adenopathy  NEUROLOGICAL: Unremarkable.  Bilateral patellar reflexes 2+. PSYCH:  Appropriate.  [facility] Labs:   CBC with diff on 09/21/2015 revealed a hematocrit of 34.7, hemoglobin 11.8, MCV 93, platelets 300,000, WBC 6400 with an ANC of 3600.  Comprehensive metabolic panel included a creatinine of 0.83, calcium 9.8, albumen 4.7, and protein 7.6.  Liver function tests were normal.  SPEP revealed no monoclonal protein.  CBC with diff on 11/02/2015 revealed a hematocrit of 33.3, hemoglobin 11.1, MCV 93, platelets 257,000, WBC 5100 with an ANC of 1900.  Comprehensive metabolic panel included a creatinine of 0.82, calcium 9.4, albumen 4.4, and protein 7.1.  Liver function tests were normal.    Assessment:  Annette Jimenez is a 55 y.o. female with stage III IgG lambda multiple myeloma. She presented with acute back pain and compression fracture of T2 on 02/02/2013. SPEP revealed 1.9 g/dL IgG lambda monoclonal protein. Kappa free light chains were 11.3 and lambda free light chains were 42.68. Bone survey on 02/26/2013 revealed L2 lytic lesion and C6 inferior endplate subtle concave compression deformity. Bone marrow biopsy revealed 11% plasma cells by aspirate and 30% plasma cells by CD138. Cytogenetics were normal. FISH showed FGFR3/IGH translocation t(4;14), monosomy 13, and gain of 1q21.   She received radiation to T2. She received RVD chemotherapy. She underwent autologous stem cell transplant on 12/10/2013. Following transplant in 02/2014, she began Velcade every 2 weeks for maintenance therapy. She receives Zometa monthly (last 03/25/2015). SPEP revealed no monoclonal protein on 01/26/2015, 04/18/2015,  and 08/10/2015. Light chains were normal on 03/22/2015 and 04/18/2015.  She has received immunizations post transplant.  She received her second and third round of [place], [place], and ActHIB on 04/08/2015 and 06/17/2015.  She continues every 2 week maintenance Velcade (last 10/21/2015).  She receives Zometa monthly (last 09/23/2015).  Symptomatically, she feels good.  Exam is unremarkable.  Labs are normal.  Plan: 1.  Review labs from [facility]. 2.  Velcade today. 3.  Complete [facility] slips for next 2 visits.  4.  Anticipate re-evaluation in 12/15/2015 at [facility] BMT assessment.   5.  Dental check every 6 months (next 11/15/2015). 6.  RTC on 04/21  for review of labs and Velcade + Zometa. 7.  RTC on 12/02/2015 for MD assessment , review of labs, and Velcade.   Lequita Asal, MD  11/04/2015, 2:32 PM

## 2015-11-18 ENCOUNTER — Inpatient Hospital Stay: Payer: 59

## 2015-11-18 ENCOUNTER — Other Ambulatory Visit: Payer: Self-pay | Admitting: Hematology and Oncology

## 2015-11-18 VITALS — BP 124/85 | HR 77 | Temp 97.5°F | Resp 18

## 2015-11-18 DIAGNOSIS — C9001 Multiple myeloma in remission: Secondary | ICD-10-CM

## 2015-11-18 DIAGNOSIS — C9000 Multiple myeloma not having achieved remission: Secondary | ICD-10-CM | POA: Diagnosis not present

## 2015-11-18 DIAGNOSIS — C9002 Multiple myeloma in relapse: Secondary | ICD-10-CM

## 2015-11-18 MED ORDER — ONDANSETRON HCL 4 MG PO TABS
4.0000 mg | ORAL_TABLET | Freq: Once | ORAL | Status: AC
  Administered 2015-11-18: 4 mg via ORAL
  Filled 2015-11-18: qty 1

## 2015-11-18 MED ORDER — ZOLEDRONIC ACID 4 MG/100ML IV SOLN
4.0000 mg | Freq: Once | INTRAVENOUS | Status: AC
Start: 2015-11-18 — End: 2015-11-18
  Administered 2015-11-18: 4 mg via INTRAVENOUS
  Filled 2015-11-18: qty 100

## 2015-11-18 MED ORDER — BORTEZOMIB CHEMO SQ INJECTION 3.5 MG (2.5MG/ML)
1.3000 mg/m2 | Freq: Once | INTRAMUSCULAR | Status: AC
  Administered 2015-11-18: 2.5 mg via SUBCUTANEOUS
  Filled 2015-11-18: qty 2.5

## 2015-11-22 ENCOUNTER — Encounter: Payer: Self-pay | Admitting: Hematology and Oncology

## 2015-11-28 ENCOUNTER — Encounter: Payer: Self-pay | Admitting: Hematology and Oncology

## 2015-12-02 ENCOUNTER — Other Ambulatory Visit: Payer: Self-pay | Admitting: Hematology and Oncology

## 2015-12-02 ENCOUNTER — Inpatient Hospital Stay: Payer: 59 | Attending: Hematology and Oncology | Admitting: Hematology and Oncology

## 2015-12-02 ENCOUNTER — Encounter: Payer: Self-pay | Admitting: Hematology and Oncology

## 2015-12-02 ENCOUNTER — Inpatient Hospital Stay: Payer: 59

## 2015-12-02 VITALS — BP 135/91 | HR 91 | Temp 96.4°F | Resp 18 | Ht 61.0 in | Wt 192.5 lb

## 2015-12-02 DIAGNOSIS — C9001 Multiple myeloma in remission: Secondary | ICD-10-CM | POA: Diagnosis not present

## 2015-12-02 DIAGNOSIS — Z8781 Personal history of (healed) traumatic fracture: Secondary | ICD-10-CM | POA: Insufficient documentation

## 2015-12-02 DIAGNOSIS — Z5111 Encounter for antineoplastic chemotherapy: Secondary | ICD-10-CM | POA: Insufficient documentation

## 2015-12-02 DIAGNOSIS — I10 Essential (primary) hypertension: Secondary | ICD-10-CM | POA: Diagnosis not present

## 2015-12-02 DIAGNOSIS — E119 Type 2 diabetes mellitus without complications: Secondary | ICD-10-CM | POA: Insufficient documentation

## 2015-12-02 DIAGNOSIS — Z923 Personal history of irradiation: Secondary | ICD-10-CM | POA: Insufficient documentation

## 2015-12-02 DIAGNOSIS — C9002 Multiple myeloma in relapse: Secondary | ICD-10-CM

## 2015-12-02 DIAGNOSIS — Z79899 Other long term (current) drug therapy: Secondary | ICD-10-CM | POA: Diagnosis not present

## 2015-12-02 DIAGNOSIS — Z9484 Stem cells transplant status: Secondary | ICD-10-CM | POA: Diagnosis not present

## 2015-12-02 MED ORDER — ONDANSETRON HCL 4 MG PO TABS
4.0000 mg | ORAL_TABLET | Freq: Once | ORAL | Status: AC
  Administered 2015-12-02: 4 mg via ORAL
  Filled 2015-12-02: qty 1

## 2015-12-02 MED ORDER — BORTEZOMIB CHEMO SQ INJECTION 3.5 MG (2.5MG/ML)
1.3000 mg/m2 | Freq: Once | INTRAMUSCULAR | Status: AC
  Administered 2015-12-02: 2.5 mg via SUBCUTANEOUS
  Filled 2015-12-02: qty 2.5

## 2015-12-02 NOTE — Progress Notes (Signed)
[place] [facility] day:  12/02/2015   Chief Complaint: Annette Jimenez is an 55 y.o. female with multiple myeloma status post autologous stem cell transplant who is seen for assessment prior to every other week Velcade.  HPI: The patient was last seen in the medical oncology clinic by me on 11/04/2015.  At that time, she was doing well.  Labs from 11/02/2015 were normal including a CBC with diff, CMP, SPEP.  She received Velcade.  She received Velcade and Zometa on 11/18/2015.    She saw the dentist.  She is scheduled for a crown in 12/2015.  She is scheduled for follow-up with [facility] transplant team on 12/15/2015.  Mammogram on 10/27/2015 revealed no suspicious findings.  Symptomatically, she denies any concerns.  She feels good.   Past Medical History  Diagnosis Date  . Cancer [facility])     Multiple Myeloma  . Hypertension   . Diabetes mellitus without complication ([place])     pre diabetic    Past Surgical History  Procedure Laterality Date  . Tubal ligation Left     Pt believes in was 1996  . Limbal stem cell transplant  2015    Family History  Problem Relation Age of Onset  . Cancer Mother   . Hypertension Mother   . Cancer Sister   . Diabetes Sister   . Crohn's disease Sister     Social History:  reports that she has never smoked. She has never used smokeless tobacco. She reports that she does not drink alcohol or use illicit drugs.  The patient is alone today  Allergies:  Allergies  Allergen Reactions  . Bee Venom Swelling    Current Medications: Current Outpatient Prescriptions  Medication Sig Dispense Refill  . amLODipine (NORVASC) 5 MG tablet Take 5 mg by mouth daily.    . valACYclovir (VALTREX) 500 MG tablet Take 1 tablet (500 mg total) by mouth daily. 90 tablet 3   No current facility-administered medications for this visit.   Facility-Administered Medications Ordered in Other Visits  Medication Dose Route Frequency  Provider Last Rate Last Dose  . 0.9 %  sodium chloride infusion   Intravenous Continuous Lequita Asal, MD   Stopped at 07/15/15 1507  . 0.9 %  sodium chloride infusion   Intravenous Once Lequita Asal, MD      . bortezomib SQ (VELCADE) chemo injection 2.5 mg  1.3 mg/m2 (Order-Specific) Subcutaneous Once Lequita Asal, MD   2.5 mg at 01/14/15 1458   Review of Systems:  GENERAL:  Feels good.  No fevers or sweats.  Weight down 3 pounds. PERFORMANCE STATUS (ECOG):  0 HEENT:  No visual changes, runny nose, sore throat, mouth sores or tenderness.  Dental crown scheduled in 12/2015. Lungs: No shortness of breath or cough.  No hemoptysis. Cardiac:  No chest pain, palpitations, orthopnea, or PND. GI:  No nausea, vomiting, diarrhea, constipation, melena or hematochezia. GU:  No urgency, frequency, dysuria, or hematuria. Musculoskeletal:  No back pain.  No joint pain.  No muscle tenderness. Extremities:  No pain or swelling. Skin:  No rashes or skin changes. Neuro:  No headache, numbness or weakness, balance or coordination issues. Endocrine:  No diabetes, thyroid issues, hot flashes or night sweats. Psych:  No mood changes, depression or anxiety. Pain:  No focal pain. Review of systems:  All other systems reviewed and found to be negative.  Physical Exam: Blood pressure 135/91, pulse 91, temperature 96.4 F (  35.8 C), temperature source Tympanic, resp. rate 18, height '5\' 1"'  (1.549 m), weight 192 lb 7.4 oz (87.3 kg). GENERAL:  Well developed, well nourished, sitting comfortably in the exam room in no acute distress. MENTAL STATUS:  Alert and oriented to person, place and time. HEAD:  Styled short brown hair.  Normocephalic, atraumatic, face symmetric, no Cushingoid features. EYES:  Brown eyes.  Pupils equal round and reactive to light and accomodation.  No conjunctivitis or scleral icterus. ENT:  Oropharynx clear without lesion.  Tongue normal. Mucous membranes moist.  RESPIRATORY:   Clear to auscultation without rales, wheezes or rhonchi. CARDIOVASCULAR:  Regular rate and rhythm without murmur, rub or gallop. ABDOMEN:  Soft, non-tender, with active bowel sounds, and no hepatosplenomegaly.  No masses. SKIN:  No rashes, ulcers or lesions. EXTREMITIES: No edema, no skin discoloration or tenderness.  No palpable cords. LYMPH NODES: No palpable cervical, supraclavicular, axillary or inguinal adenopathy  NEUROLOGICAL: Unremarkable.  Bilateral patellar reflexes 2+. PSYCH:  Appropriate.  [facility] Labs:   CBC with diff on 09/21/2015 revealed a hematocrit of 34.7, hemoglobin 11.8, MCV 93, platelets 300,000, WBC 6400 with an ANC of 3600.  Comprehensive metabolic panel included a creatinine of 0.83, calcium 9.8, albumen 4.7, and protein 7.6.  Liver function tests were normal.  SPEP revealed no monoclonal protein.  CBC with diff on 11/02/2015 revealed a hematocrit of 33.3, hemoglobin 11.1, MCV 93, platelets 257,000, WBC 5100 with an ANC of 1900.  Comprehensive metabolic panel included a creatinine of 0.82, calcium 9.4, albumen 4.4, and protein 7.1.  Liver function tests were normal.   CBC with diff on 11/17/2015 revealed a hematocrit of 35.9, hemoglobin 11.9, MCV 94, platelets 284,000, WBC 6800 with an ANC of 3700.  Comprehensive metabolic panel included a creatinine of 0.84, calcium 9.2, albumen 4.4, and protein 7.5.  Liver function tests were normal.   CBC with diff on 12/01/2015 revealed a hematocrit of 35.8, hemoglobin 12.0, MCV 96, platelets 228,000, WBC 6100 with an ANC of 3200.  Comprehensive metabolic panel included a creatinine of 0.87, calcium 9.5, albumen 4.6, and protein 7.5.  Liver function tests were normal.    Assessment:  Annette Jimenez is a 55 y.o. female with stage III IgG lambda multiple myeloma. She presented with acute back pain and compression fracture of T2 on 02/02/2013. SPEP revealed 1.9 g/dL IgG lambda monoclonal protein. Kappa free light chains were 11.3  and lambda free light chains were 42.68. Bone survey on 02/26/2013 revealed L2 lytic lesion and C6 inferior endplate subtle concave compression deformity. Bone marrow biopsy revealed 11% plasma cells by aspirate and 30% plasma cells by CD138. Cytogenetics were normal. FISH showed FGFR3/IGH translocation t(4;14), monosomy 13, and gain of 1q21.   She received radiation to T2. She received RVD chemotherapy. She underwent autologous stem cell transplant on 12/10/2013. Following transplant in 02/2014, she began Velcade every 2 weeks for maintenance therapy. She receives Zometa monthly. SPEP revealed no monoclonal protein on 01/26/2015, 04/18/2015, 08/10/2015, and 11/17/2015. Light chains were normal on 03/22/2015 and 04/18/2015.  She has received immunizations post transplant.  She received her second and third round of [place], [place], and ActHIB on 04/08/2015 and 06/17/2015.  She continues every 2 week maintenance Velcade (last 11/18/2015).  She receives Zometa monthly (last 11/18/2015).  Symptomatically, she feels well.  Exam is unremarkable.  Labs are normal.  Plan: 1.  Review labs from [facility]. 2.  Velcade today. 3.  Complete [facility] slips for next 2 visits.  4.  Anticipate  re-evaluation on 12/15/2015 with [facility] BMT service.   5.  RTC on 12/23/2015 for MD assessment , review of labs, and +/- Velcade and Zometa.   Lequita Asal, MD  12/02/2015, 2:08 PM

## 2015-12-02 NOTE — Progress Notes (Signed)
Pt reports no changes since last visit.  See [facility] on 5/18

## 2015-12-22 ENCOUNTER — Other Ambulatory Visit: Payer: Self-pay | Admitting: Hematology and Oncology

## 2015-12-23 ENCOUNTER — Inpatient Hospital Stay: Payer: 59

## 2015-12-23 ENCOUNTER — Encounter: Payer: Self-pay | Admitting: Hematology and Oncology

## 2015-12-23 ENCOUNTER — Other Ambulatory Visit: Payer: 59

## 2015-12-23 ENCOUNTER — Inpatient Hospital Stay (HOSPITAL_BASED_OUTPATIENT_CLINIC_OR_DEPARTMENT_OTHER): Payer: 59 | Admitting: Hematology and Oncology

## 2015-12-23 VITALS — HR 96 | Temp 96.2°F | Resp 18 | Ht 61.0 in | Wt 194.4 lb

## 2015-12-23 DIAGNOSIS — Z9484 Stem cells transplant status: Secondary | ICD-10-CM

## 2015-12-23 DIAGNOSIS — Z8781 Personal history of (healed) traumatic fracture: Secondary | ICD-10-CM

## 2015-12-23 DIAGNOSIS — E119 Type 2 diabetes mellitus without complications: Secondary | ICD-10-CM

## 2015-12-23 DIAGNOSIS — C9001 Multiple myeloma in remission: Secondary | ICD-10-CM | POA: Diagnosis not present

## 2015-12-23 DIAGNOSIS — I10 Essential (primary) hypertension: Secondary | ICD-10-CM

## 2015-12-23 DIAGNOSIS — Z79899 Other long term (current) drug therapy: Secondary | ICD-10-CM

## 2015-12-23 DIAGNOSIS — Z923 Personal history of irradiation: Secondary | ICD-10-CM

## 2015-12-23 DIAGNOSIS — C9002 Multiple myeloma in relapse: Secondary | ICD-10-CM

## 2015-12-23 MED ORDER — BORTEZOMIB CHEMO SQ INJECTION 3.5 MG (2.5MG/ML)
1.3000 mg/m2 | Freq: Once | INTRAMUSCULAR | Status: AC
  Administered 2015-12-23: 2.5 mg via SUBCUTANEOUS
  Filled 2015-12-23: qty 2.5

## 2015-12-23 MED ORDER — ZOLEDRONIC ACID 4 MG/100ML IV SOLN
4.0000 mg | Freq: Once | INTRAVENOUS | Status: AC
  Administered 2015-12-23: 4 mg via INTRAVENOUS
  Filled 2015-12-23 (×2): qty 100

## 2015-12-23 MED ORDER — SODIUM CHLORIDE 0.9 % IV SOLN
Freq: Once | INTRAVENOUS | Status: AC
  Administered 2015-12-23: 15:00:00 via INTRAVENOUS
  Filled 2015-12-23: qty 1000

## 2015-12-23 MED ORDER — ONDANSETRON HCL 4 MG PO TABS
4.0000 mg | ORAL_TABLET | Freq: Once | ORAL | Status: AC
  Administered 2015-12-23: 4 mg via ORAL
  Filled 2015-12-23: qty 1

## 2015-12-23 NOTE — Progress Notes (Signed)
Pt complains of left palm bruise that came up over night.  Pt seen at [facility] on 5/18

## 2015-12-23 NOTE — Progress Notes (Signed)
[facility] [facility] day:  12/23/2015   Chief Complaint: Annette Jimenez is an 55 y.o. female with multiple myeloma status post autologous stem cell transplant who is seen for assessment prior to every other week Velcade.  HPI: The patient was last seen in the medical oncology clinic by me on 11/04/2015.  At that time, she was doing well.  Labs from 11/02/2015 were normal including a CBC with diff, CMP, SPEP.  She received Velcade.  She received Velcade and Zometa on 11/18/2015.  She saw the dentist.  She is scheduled for a crown in 12/2015.    She saw Dr. Precious Haws from the [facility] transplant service on 12/15/2015.  She was felt to be in a complete remission.  She received her MMR and pneumovax booster.  Her valacyclovir was discontinued.  It was recommended that she complete 2 years of Velcade and Zometa (completes in 02/2016).  Subsequently, she will be on routine surveillance.    Symptomatically, she denies any concerns.  She feels good.  She has a bruise on her left palm (unknown etiology).   Past Medical History  Diagnosis Date  . Cancer [facility])     Multiple Myeloma  . Hypertension   . Diabetes mellitus without complication ([place])     pre diabetic    Past Surgical History  Procedure Laterality Date  . Tubal ligation Left     Pt believes in was 1996  . Limbal stem cell transplant  2015    Family History  Problem Relation Age of Onset  . Cancer Mother   . Hypertension Mother   . Cancer Sister   . Diabetes Sister   . Crohn's disease Sister     Social History:  reports that she has never smoked. She has never used smokeless tobacco. She reports that she does not drink alcohol or use illicit drugs.  The patient is alone today  Allergies:  Allergies  Allergen Reactions  . Bee Venom Swelling    Current Medications: Current Outpatient Prescriptions  Medication Sig Dispense Refill  . amLODipine (NORVASC) 5 MG tablet Take 5 mg by  mouth daily.     No current facility-administered medications for this visit.   Facility-Administered Medications Ordered in Other Visits  Medication Dose Route Frequency Provider Last Rate Last Dose  . 0.9 %  sodium chloride infusion   Intravenous Continuous Lequita Asal, MD   Stopped at 07/15/15 1507  . 0.9 %  sodium chloride infusion   Intravenous Once Lequita Asal, MD      . bortezomib SQ (VELCADE) chemo injection 2.5 mg  1.3 mg/m2 (Order-Specific) Subcutaneous Once Lequita Asal, MD   2.5 mg at 01/14/15 1458   Review of Systems:  GENERAL:  Feels good.  No fevers or sweats.  Weight down 2 pounds. PERFORMANCE STATUS (ECOG):  0 HEENT:  No visual changes, runny nose, sore throat, mouth sores or tenderness.  Dental crown scheduled in 12/2015. Lungs: No shortness of breath or cough.  No hemoptysis. Cardiac:  No chest pain, palpitations, orthopnea, or PND. GI:  No nausea, vomiting, diarrhea, constipation, melena or hematochezia. GU:  No urgency, frequency, dysuria, or hematuria. Musculoskeletal:  No back pain.  No joint pain.  No muscle tenderness. Extremities:  No pain or swelling. Skin:  Bruise on left palm.  No rashes or skin changes. Neuro:  No headache, numbness or weakness, balance or coordination issues. Endocrine:  No diabetes, thyroid issues, hot flashes or  night sweats. Psych:  No mood changes, depression or anxiety. Pain:  No focal pain. Review of systems:  All other systems reviewed and found to be negative.  Physical Exam: Pulse 96, temperature 96.2 F (35.7 C), temperature source Tympanic, resp. rate 18, height '5\' 1"'  (1.549 m), weight 194 lb 7.1 oz (88.2 kg), SpO2 100 %. GENERAL:  Well developed, well nourished, sitting comfortably in the exam room in no acute distress. MENTAL STATUS:  Alert and oriented to person, place and time. HEAD:  Wearing a black cap.  Styled short brown hair.  Normocephalic, atraumatic, face symmetric, no Cushingoid  features. EYES:  Brown eyes.  Pupils equal round and reactive to light and accomodation.  No conjunctivitis or scleral icterus. ENT:  Oropharynx clear without lesion.  Tongue normal. Mucous membranes moist.  RESPIRATORY:  Clear to auscultation without rales, wheezes or rhonchi. CARDIOVASCULAR:  Regular rate and rhythm without murmur, rub or gallop. ABDOMEN:  Soft, non-tender, with active bowel sounds, and no hepatosplenomegaly.  No masses. SKIN:  Faint < 1 cm left palmar area of hyperpigmentation.  No rashes, ulcers or lesions. EXTREMITIES: No edema, no skin discoloration or tenderness.  No palpable cords. LYMPH NODES: No palpable cervical, supraclavicular, axillary or inguinal adenopathy  NEUROLOGICAL: Unremarkable.  Bilateral patellar reflexes 2+. PSYCH:  Appropriate.  [facility] Labs:   CBC with diff on 09/21/2015 revealed a hematocrit of 34.7, hemoglobin 11.8, MCV 93, platelets 300,000, WBC 6400 with an ANC of 3600.  Comprehensive metabolic panel included a creatinine of 0.83, calcium 9.8, albumen 4.7, and protein 7.6.  Liver function tests were normal.  SPEP revealed no monoclonal protein.  CBC with diff on 11/02/2015 revealed a hematocrit of 33.3, hemoglobin 11.1, MCV 93, platelets 257,000, WBC 5100 with an ANC of 1900.  Comprehensive metabolic panel included a creatinine of 0.82, calcium 9.4, albumen 4.4, and protein 7.1.  Liver function tests were normal.   CBC with diff on 11/17/2015 revealed a hematocrit of 35.9, hemoglobin 11.9, MCV 94, platelets 284,000, WBC 6800 with an ANC of 3700.  Comprehensive metabolic panel included a creatinine of 0.84, calcium 9.2, albumen 4.4, and protein 7.5.  Liver function tests were normal.   CBC with diff on 12/01/2015 revealed a hematocrit of 35.8, hemoglobin 12.0, MCV 96, platelets 228,000, WBC 6100 with an ANC of 3200.  Comprehensive metabolic panel included a creatinine of 0.87, calcium 9.5, albumen 4.6, and protein 7.5.  Liver function tests were  normal.   CBC with diff on 12/21/2015 revealed a hematocrit of 34.5, hemoglobin 11.9, MCV 93, platelets 249,000, WBC 6200 with an ANC of 3500.  Comprehensive metabolic panel included a creatinine of 0.90, calcium 9.6, albumen 4.7, and protein 7.4.  Liver function tests were normal.  SPEP revealed no monoclonal protein.   Assessment:  ADEN YOUNGMAN is a 56 y.o. female with stage III IgG lambda multiple myeloma. She presented with acute back pain and compression fracture of T2 on 02/02/2013. SPEP revealed 1.9 g/dL IgG lambda monoclonal protein. Kappa free light chains were 11.3 and lambda free light chains were 42.68. Bone survey on 02/26/2013 revealed L2 lytic lesion and C6 inferior endplate subtle concave compression deformity. Bone marrow biopsy revealed 11% plasma cells by aspirate and 30% plasma cells by CD138. Cytogenetics were normal. FISH showed FGFR3/IGH translocation t(4;14), monosomy 13, and gain of 1q21.   She received radiation to T2. She received RVD chemotherapy. She underwent autologous stem cell transplant on 12/10/2013. Following transplant in 02/2014, she began Velcade every 2  weeks for maintenance therapy. She receives Zometa monthly. SPEP revealed no monoclonal protein on 01/26/2015, 04/18/2015, 08/10/2015, and 11/17/2015. Light chains were normal on 03/22/2015 and 04/18/2015.  She has received immunizations post transplant.  She received her second and third round of [place], [place], and ActHIB on 04/08/2015 and 06/17/2015.  She received her MMR and pneumococcal vaccine on 12/15/2015.  She continues every 2 week maintenance Velcade (last 12/02/2015).  She receives Zometa monthly (last 11/18/2015).  She will complete 2 years of therapy in 02/2016.  Symptomatically, she feels well.  Exam is unremarkable.  Labs are normal.  Plan: 1.  Review labs from [facility]. 2.  Discuss evaluation at [facility] transplant service. 3.  Velcade and Zometa today. 4.  Complete [facility]  slips for next 2 visits.  5.  RTC in 2 weeks for review of labs and Velcade. 6.  RTC in 4 weeks for MD assessment , review of labs, and Velcade + Zometa.   Lequita Asal, MD  12/23/2015, 2:03 PM

## 2015-12-24 ENCOUNTER — Encounter: Payer: Self-pay | Admitting: Hematology and Oncology

## 2016-01-05 ENCOUNTER — Encounter: Payer: Self-pay | Admitting: Hematology and Oncology

## 2016-01-06 ENCOUNTER — Inpatient Hospital Stay: Payer: 59 | Attending: Hematology and Oncology

## 2016-01-06 ENCOUNTER — Other Ambulatory Visit: Payer: Self-pay | Admitting: Hematology and Oncology

## 2016-01-06 VITALS — BP 128/88 | HR 93 | Temp 97.6°F

## 2016-01-06 DIAGNOSIS — M7989 Other specified soft tissue disorders: Secondary | ICD-10-CM | POA: Insufficient documentation

## 2016-01-06 DIAGNOSIS — E119 Type 2 diabetes mellitus without complications: Secondary | ICD-10-CM | POA: Diagnosis not present

## 2016-01-06 DIAGNOSIS — G629 Polyneuropathy, unspecified: Secondary | ICD-10-CM | POA: Insufficient documentation

## 2016-01-06 DIAGNOSIS — Z5111 Encounter for antineoplastic chemotherapy: Secondary | ICD-10-CM | POA: Diagnosis not present

## 2016-01-06 DIAGNOSIS — Z9221 Personal history of antineoplastic chemotherapy: Secondary | ICD-10-CM | POA: Insufficient documentation

## 2016-01-06 DIAGNOSIS — Z9484 Stem cells transplant status: Secondary | ICD-10-CM | POA: Diagnosis not present

## 2016-01-06 DIAGNOSIS — Z79899 Other long term (current) drug therapy: Secondary | ICD-10-CM | POA: Diagnosis not present

## 2016-01-06 DIAGNOSIS — Z923 Personal history of irradiation: Secondary | ICD-10-CM | POA: Insufficient documentation

## 2016-01-06 DIAGNOSIS — C9000 Multiple myeloma not having achieved remission: Secondary | ICD-10-CM | POA: Insufficient documentation

## 2016-01-06 DIAGNOSIS — I10 Essential (primary) hypertension: Secondary | ICD-10-CM | POA: Insufficient documentation

## 2016-01-06 DIAGNOSIS — C9002 Multiple myeloma in relapse: Secondary | ICD-10-CM

## 2016-01-06 MED ORDER — BORTEZOMIB CHEMO SQ INJECTION 3.5 MG (2.5MG/ML)
1.3000 mg/m2 | Freq: Once | INTRAMUSCULAR | Status: AC
  Administered 2016-01-06: 2.5 mg via SUBCUTANEOUS
  Filled 2016-01-06: qty 2.5

## 2016-01-06 MED ORDER — ONDANSETRON HCL 4 MG PO TABS
4.0000 mg | ORAL_TABLET | Freq: Once | ORAL | Status: AC
  Administered 2016-01-06: 4 mg via ORAL
  Filled 2016-01-06: qty 1

## 2016-01-20 ENCOUNTER — Other Ambulatory Visit: Payer: Self-pay | Admitting: Hematology and Oncology

## 2016-01-20 ENCOUNTER — Inpatient Hospital Stay: Payer: 59

## 2016-01-20 ENCOUNTER — Encounter: Payer: Self-pay | Admitting: Hematology and Oncology

## 2016-01-20 ENCOUNTER — Inpatient Hospital Stay (HOSPITAL_BASED_OUTPATIENT_CLINIC_OR_DEPARTMENT_OTHER): Payer: 59 | Admitting: Hematology and Oncology

## 2016-01-20 VITALS — BP 128/81 | HR 88 | Temp 95.2°F | Resp 18 | Ht 61.0 in | Wt 193.8 lb

## 2016-01-20 DIAGNOSIS — C9000 Multiple myeloma not having achieved remission: Secondary | ICD-10-CM

## 2016-01-20 DIAGNOSIS — Z9221 Personal history of antineoplastic chemotherapy: Secondary | ICD-10-CM

## 2016-01-20 DIAGNOSIS — G620 Drug-induced polyneuropathy: Secondary | ICD-10-CM | POA: Insufficient documentation

## 2016-01-20 DIAGNOSIS — Z79899 Other long term (current) drug therapy: Secondary | ICD-10-CM

## 2016-01-20 DIAGNOSIS — M7989 Other specified soft tissue disorders: Secondary | ICD-10-CM | POA: Diagnosis not present

## 2016-01-20 DIAGNOSIS — E119 Type 2 diabetes mellitus without complications: Secondary | ICD-10-CM

## 2016-01-20 DIAGNOSIS — C9002 Multiple myeloma in relapse: Secondary | ICD-10-CM

## 2016-01-20 DIAGNOSIS — G629 Polyneuropathy, unspecified: Secondary | ICD-10-CM

## 2016-01-20 DIAGNOSIS — Z923 Personal history of irradiation: Secondary | ICD-10-CM

## 2016-01-20 DIAGNOSIS — T451X5A Adverse effect of antineoplastic and immunosuppressive drugs, initial encounter: Secondary | ICD-10-CM

## 2016-01-20 DIAGNOSIS — Z9484 Stem cells transplant status: Secondary | ICD-10-CM

## 2016-01-20 DIAGNOSIS — I10 Essential (primary) hypertension: Secondary | ICD-10-CM | POA: Diagnosis not present

## 2016-01-20 DIAGNOSIS — C9001 Multiple myeloma in remission: Secondary | ICD-10-CM

## 2016-01-20 MED ORDER — SODIUM CHLORIDE 0.9 % IV SOLN
Freq: Once | INTRAVENOUS | Status: AC
  Administered 2016-01-20: 14:00:00 via INTRAVENOUS
  Filled 2016-01-20: qty 1000

## 2016-01-20 MED ORDER — BORTEZOMIB CHEMO SQ INJECTION 3.5 MG (2.5MG/ML)
1.3000 mg/m2 | Freq: Once | INTRAMUSCULAR | Status: AC
  Administered 2016-01-20: 2.5 mg via SUBCUTANEOUS
  Filled 2016-01-20: qty 2.5

## 2016-01-20 MED ORDER — ONDANSETRON HCL 4 MG PO TABS
4.0000 mg | ORAL_TABLET | Freq: Once | ORAL | Status: AC
  Administered 2016-01-20: 4 mg via ORAL
  Filled 2016-01-20: qty 1

## 2016-01-20 MED ORDER — ZOLEDRONIC ACID 4 MG/100ML IV SOLN
4.0000 mg | Freq: Once | INTRAVENOUS | Status: AC
  Administered 2016-01-20: 4 mg via INTRAVENOUS
  Filled 2016-01-20: qty 100

## 2016-01-20 NOTE — Progress Notes (Signed)
Annette Jimenez  Chief Complaint: Annette Jimenez is an 55 y.o. female with multiple myeloma status post autologous stem cell transplant who is seen for assessment prior to every other week Velcade.  HPI: The patient was last seen in the medical oncology clinic by on 12/23/2015.  At that time, she felt well.  Labs revealed a normal CBC with diff and CMP.  SPEP revealed no monoclonal protein.  She received her Velcade and Zometa uneventfully.  She received Velcade on 01/06/2016.   During the interim, she notes a little bit of neuropathy in her hands and feet which comes and goes.  It does not affect function.  She noticed it a day or two after her treatment.  It is only at the tips of her fingers in her left hand today.  Her toes feel fine today.  She notes a little right ankle swelling for 2 days.   Past Medical History  Diagnosis Date  . Cancer [facility])     Multiple Myeloma  . Hypertension   . Diabetes mellitus without complication ([place])     pre diabetic    Past Surgical History  Procedure Laterality Date  . Tubal ligation Left     Pt believes in was 1996  . Limbal stem cell transplant  2015    Family History  Problem Relation Age of Onset  . Cancer Mother   . Hypertension Mother   . Cancer Sister   . Diabetes Sister   . Crohn's disease Sister     Social History:  reports that she has never smoked. She has never used smokeless tobacco. She reports that she does not drink alcohol or use illicit drugs.  The patient is alone today  Allergies:  Allergies  Allergen Reactions  . Bee Venom Swelling    Current Medications: Current Outpatient Prescriptions  Medication Sig Dispense Refill  . amLODipine (NORVASC) 5 MG tablet Take 5 mg by mouth daily.     No current facility-administered medications for this visit.   Facility-Administered Medications Ordered in Other Visits  Medication Dose Route Frequency Provider  Last Rate Last Dose  . 0.9 %  sodium chloride infusion   Intravenous Continuous Lequita Asal, MD   Stopped at 07/15/15 1507  . 0.9 %  sodium chloride infusion   Intravenous Once Lequita Asal, MD      . bortezomib SQ (VELCADE) chemo injection 2.5 mg  1.3 mg/m2 (Order-Specific) Subcutaneous Once Lequita Asal, MD   2.5 mg at 01/14/15 1458   Review of Systems:  GENERAL:  Feels good.  No fevers or sweats.  Weight down 2 pounds. PERFORMANCE STATUS (ECOG):  0 HEENT:  No visual changes, runny nose, sore throat, mouth sores or tenderness.  Dental crown scheduled in 12/2015. Lungs: No shortness of breath or cough.  No hemoptysis. Cardiac:  No chest pain, palpitations, orthopnea, or PND. GI:  No nausea, vomiting, diarrhea, constipation, melena or hematochezia. GU:  No urgency, frequency, dysuria, or hematuria. Musculoskeletal:  No back pain.  No joint pain.  No muscle tenderness. Extremities:  No pain or swelling. Skin:  Bruise on left palm.  No rashes or skin changes. Neuro:  No headache, numbness or weakness, balance or coordination issues. Endocrine:  No diabetes, thyroid issues, hot flashes or night sweats. Psych:  No mood changes, depression or anxiety. Pain:  No focal pain. Review of systems:  All other systems reviewed and found to be  negative.  Physical Exam: Blood pressure 128/81, pulse 88, temperature 95.2 F (35.1 C), temperature source Tympanic, resp. rate 18, height '5\' 1"'  (1.549 m), weight 193 lb 12.6 oz (87.9 kg). GENERAL:  Well developed, well nourished, woman sitting comfortably in the exam room in no acute distress. MENTAL STATUS:  Alert and oriented to person, place and time. HEAD:  Wearing a black cap.  Styled short brown hair.  Normocephalic, atraumatic, face symmetric, no Cushingoid features. EYES:  Brown eyes.  Pupils equal round and reactive to light and accomodation.  No conjunctivitis or scleral icterus. ENT:  Oropharynx clear without lesion.  Tongue  normal. Mucous membranes moist.  RESPIRATORY:  Clear to auscultation without rales, wheezes or rhonchi. CARDIOVASCULAR:  Regular rate and rhythm without murmur, rub or gallop. ABDOMEN:  Soft, non-tender, with active bowel sounds, and no hepatosplenomegaly.  No masses. SKIN:  No rashes, ulcers or lesions. EXTREMITIES: No edema, no skin discoloration or tenderness.  No palpable cords. LYMPH NODES: No palpable cervical, supraclavicular, axillary or inguinal adenopathy  NEUROLOGICAL: Unremarkable.  Bilateral patellar reflexes 2+. PSYCH:  Appropriate.  [facility] Labs:   CBC with diff on 09/21/2015 revealed a hematocrit of 34.7, hemoglobin 11.8, MCV 93, platelets 300,000, WBC 6400 with an ANC of 3600.  Comprehensive metabolic panel included a creatinine of 0.83, calcium 9.8, albumen 4.7, and protein 7.6.  Liver function tests were normal.  SPEP revealed no monoclonal protein.  CBC with diff on 11/02/2015 revealed a hematocrit of 33.3, hemoglobin 11.1, MCV 93, platelets 257,000, WBC 5100 with an ANC of 1900.  Comprehensive metabolic panel included a creatinine of 0.82, calcium 9.4, albumen 4.4, and protein 7.1.  Liver function tests were normal.   CBC with diff on 11/17/2015 revealed a hematocrit of 35.9, hemoglobin 11.9, MCV 94, platelets 284,000, WBC 6800 with an ANC of 3700.  Comprehensive metabolic panel included a creatinine of 0.84, calcium 9.2, albumen 4.4, and protein 7.5.  Liver function tests were normal.   CBC with diff on 12/01/2015 revealed a hematocrit of 35.8, hemoglobin 12.0, MCV 96, platelets 228,000, WBC 6100 with an ANC of 3200.  Comprehensive metabolic panel included a creatinine of 0.87, calcium 9.5, albumen 4.6, and protein 7.5.  Liver function tests were normal.   CBC with diff on 12/21/2015 revealed a hematocrit of 34.5, hemoglobin 11.9, MCV 93, platelets 249,000, WBC 6200 with an ANC of 3500.  Comprehensive metabolic panel included a creatinine of 0.90, calcium 9.6, albumen 4.7,  and protein 7.4.  Liver function tests were normal.  SPEP revealed no monoclonal protein.  CBC with diff on 01/18/2016 revealed a hematocrit of 33.8, hemoglobin 11.4, MCV 92, platelets 262,000, WBC 6300 with an ANC of 3400.  Comprehensive metabolic panel included a creatinine of 0.79, calcium 9.5, albumen 4.4, and protein 7.4.  Liver function tests were normal.  SPEP revealed no monoclonal protein.   Assessment:  Annette Jimenez is a 55 y.o. female with stage III IgG lambda multiple myeloma. She presented with acute back pain and compression fracture of T2 on 02/02/2013. SPEP revealed 1.9 g/dL IgG lambda monoclonal protein. Kappa free light chains were 11.3 and lambda free light chains were 42.68. Bone survey on 02/26/2013 revealed L2 lytic lesion and C6 inferior endplate subtle concave compression deformity. Bone marrow biopsy revealed 11% plasma cells by aspirate and 30% plasma cells by CD138. Cytogenetics were normal. FISH showed FGFR3/IGH translocation t(4;14), monosomy 13, and gain of 1q21.   She received radiation to T2. She received RVD chemotherapy. She  underwent autologous stem cell transplant on 12/10/2013. Following transplant in 02/2014, she began Velcade every 2 weeks for maintenance therapy. She receives Zometa monthly. SPEP revealed no monoclonal protein on 01/26/2015, 04/18/2015, 08/10/2015, 11/17/2015, 12/21/2015, and 01/18/2016. Light chains were normal on 03/22/2015 and 04/18/2015.  She has received immunizations post transplant.  She received her second and third round of [place], [place], and ActHIB on 04/08/2015 and 06/17/2015.  She received her MMR and pneumococcal vaccine on 12/15/2015.  She continues every 2 week maintenance Velcade (last 01/06/2016).  She receives Zometa monthly (last 12/23/2015).  She will complete 2 years of therapy in 02/2016.  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  Exam is unremarkable.  Labs are  normal.  Plan: 1.  Review labs from [facility]. 2.  Discuss neuropathy.  No indication for dose adjustment.  Continue to observe. 3.  Velcade and Zometa today. 4.  Complete [facility] slips for next 2 visits.  5.  RTC in 2 weeks for review of labs and Velcade. 6.  RTC in 4 weeks for MD assessment , review of labs, and Velcade + Zometa.   Lequita Asal, MD  01/20/2016, 1:46 PM

## 2016-01-20 NOTE — Progress Notes (Signed)
Reports she is feeling the neuropathy just a little bit more in hands and feet.  It comes and goes.

## 2016-01-29 ENCOUNTER — Encounter: Payer: Self-pay | Admitting: Hematology and Oncology

## 2016-02-01 ENCOUNTER — Encounter: Payer: Self-pay | Admitting: Hematology and Oncology

## 2016-02-02 ENCOUNTER — Other Ambulatory Visit: Payer: Self-pay | Admitting: Hematology and Oncology

## 2016-02-03 ENCOUNTER — Inpatient Hospital Stay: Payer: 59 | Attending: Hematology and Oncology

## 2016-02-03 VITALS — BP 109/77 | HR 89 | Temp 97.4°F

## 2016-02-03 DIAGNOSIS — G629 Polyneuropathy, unspecified: Secondary | ICD-10-CM | POA: Insufficient documentation

## 2016-02-03 DIAGNOSIS — Z8781 Personal history of (healed) traumatic fracture: Secondary | ICD-10-CM | POA: Diagnosis not present

## 2016-02-03 DIAGNOSIS — Z5111 Encounter for antineoplastic chemotherapy: Secondary | ICD-10-CM | POA: Insufficient documentation

## 2016-02-03 DIAGNOSIS — Z9484 Stem cells transplant status: Secondary | ICD-10-CM | POA: Insufficient documentation

## 2016-02-03 DIAGNOSIS — Z79899 Other long term (current) drug therapy: Secondary | ICD-10-CM | POA: Diagnosis not present

## 2016-02-03 DIAGNOSIS — Z809 Family history of malignant neoplasm, unspecified: Secondary | ICD-10-CM | POA: Diagnosis not present

## 2016-02-03 DIAGNOSIS — I10 Essential (primary) hypertension: Secondary | ICD-10-CM | POA: Insufficient documentation

## 2016-02-03 DIAGNOSIS — C9000 Multiple myeloma not having achieved remission: Secondary | ICD-10-CM | POA: Diagnosis present

## 2016-02-03 DIAGNOSIS — D649 Anemia, unspecified: Secondary | ICD-10-CM | POA: Insufficient documentation

## 2016-02-03 DIAGNOSIS — C9002 Multiple myeloma in relapse: Secondary | ICD-10-CM

## 2016-02-03 MED ORDER — BORTEZOMIB CHEMO SQ INJECTION 3.5 MG (2.5MG/ML)
1.3000 mg/m2 | Freq: Once | INTRAMUSCULAR | Status: AC
  Administered 2016-02-03: 2.5 mg via SUBCUTANEOUS
  Filled 2016-02-03: qty 1

## 2016-02-03 MED ORDER — ONDANSETRON HCL 4 MG PO TABS
4.0000 mg | ORAL_TABLET | Freq: Once | ORAL | Status: AC
  Administered 2016-02-03: 4 mg via ORAL
  Filled 2016-02-03: qty 1

## 2016-02-17 ENCOUNTER — Other Ambulatory Visit: Payer: Self-pay | Admitting: Hematology and Oncology

## 2016-02-17 ENCOUNTER — Inpatient Hospital Stay (HOSPITAL_BASED_OUTPATIENT_CLINIC_OR_DEPARTMENT_OTHER): Payer: 59 | Admitting: Hematology and Oncology

## 2016-02-17 ENCOUNTER — Encounter: Payer: Self-pay | Admitting: Hematology and Oncology

## 2016-02-17 ENCOUNTER — Inpatient Hospital Stay: Payer: 59

## 2016-02-17 VITALS — BP 143/95 | HR 96 | Temp 98.9°F | Resp 18 | Wt 194.0 lb

## 2016-02-17 DIAGNOSIS — C9000 Multiple myeloma not having achieved remission: Secondary | ICD-10-CM | POA: Diagnosis not present

## 2016-02-17 DIAGNOSIS — Z79899 Other long term (current) drug therapy: Secondary | ICD-10-CM

## 2016-02-17 DIAGNOSIS — E538 Deficiency of other specified B group vitamins: Secondary | ICD-10-CM

## 2016-02-17 DIAGNOSIS — C9002 Multiple myeloma in relapse: Secondary | ICD-10-CM

## 2016-02-17 DIAGNOSIS — I10 Essential (primary) hypertension: Secondary | ICD-10-CM | POA: Diagnosis not present

## 2016-02-17 DIAGNOSIS — C9001 Multiple myeloma in remission: Secondary | ICD-10-CM

## 2016-02-17 DIAGNOSIS — D649 Anemia, unspecified: Secondary | ICD-10-CM | POA: Diagnosis not present

## 2016-02-17 DIAGNOSIS — Z8781 Personal history of (healed) traumatic fracture: Secondary | ICD-10-CM

## 2016-02-17 DIAGNOSIS — Z9484 Stem cells transplant status: Secondary | ICD-10-CM

## 2016-02-17 DIAGNOSIS — Z809 Family history of malignant neoplasm, unspecified: Secondary | ICD-10-CM

## 2016-02-17 MED ORDER — SODIUM CHLORIDE 0.9 % IV SOLN
Freq: Once | INTRAVENOUS | Status: AC
  Administered 2016-02-17: 15:00:00 via INTRAVENOUS
  Filled 2016-02-17: qty 1000

## 2016-02-17 MED ORDER — ONDANSETRON HCL 4 MG PO TABS
4.0000 mg | ORAL_TABLET | Freq: Once | ORAL | Status: AC
  Administered 2016-02-17: 4 mg via ORAL
  Filled 2016-02-17: qty 1

## 2016-02-17 MED ORDER — ZOLEDRONIC ACID 4 MG/100ML IV SOLN
4.0000 mg | Freq: Once | INTRAVENOUS | Status: AC
  Administered 2016-02-17: 4 mg via INTRAVENOUS
  Filled 2016-02-17: qty 100

## 2016-02-17 MED ORDER — BORTEZOMIB CHEMO SQ INJECTION 3.5 MG (2.5MG/ML)
1.3000 mg/m2 | Freq: Once | INTRAMUSCULAR | Status: AC
  Administered 2016-02-17: 2.5 mg via SUBCUTANEOUS
  Filled 2016-02-17: qty 2.5

## 2016-02-17 NOTE — Progress Notes (Signed)
[place] [facility] day:  02/17/2016   Chief Complaint: Annette Jimenez is an 55 y.o. female with multiple myeloma status post autologous stem cell transplant who is seen for assessment prior to every other week Velcade.  HPI: The patient was last seen in the medical oncology clinic by on 01/20/2016.  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  Exam was unremarkable.  Labs were normal.  She received Velcade and Zometa.  She received Velcade on 02/03/2016.   She notes resolution of her neuropathy.  Symptomatically, she denies any concerns.  She states that her 2 years post transplant Velcade is completing mid August.   Past Medical History  Diagnosis Date  . Cancer [facility])     Multiple Myeloma  . Hypertension   . Diabetes mellitus without complication ([place])     pre diabetic    Past Surgical History  Procedure Laterality Date  . Tubal ligation Left     Pt believes in was 1996  . Limbal stem cell transplant  2015    Family History  Problem Relation Age of Onset  . Cancer Mother   . Hypertension Mother   . Cancer Sister   . Diabetes Sister   . Crohn's disease Sister     Social History:  reports that she has never smoked. She has never used smokeless tobacco. She reports that she does not drink alcohol or use illicit drugs.  The patient is alone today  Allergies:  Allergies  Allergen Reactions  . Bee Venom Swelling    Current Medications: Current Outpatient Prescriptions  Medication Sig Dispense Refill  . amLODipine (NORVASC) 5 MG tablet Take 5 mg by mouth daily.     No current facility-administered medications for this visit.   Facility-Administered Medications Ordered in Other Visits  Medication Dose Route Frequency Provider Last Rate Last Dose  . 0.9 %  sodium chloride infusion   Intravenous Continuous Lequita Asal, MD   Stopped at 07/15/15 1507  . 0.9 %  sodium chloride infusion   Intravenous Once  Lequita Asal, MD      . bortezomib SQ (VELCADE) chemo injection 2.5 mg  1.3 mg/m2 (Order-Specific) Subcutaneous Once Lequita Asal, MD   2.5 mg at 01/14/15 1458   Review of Systems:  GENERAL:  Feels good.  No fevers or sweats.  Weight down 1 pound. PERFORMANCE STATUS (ECOG):  0 HEENT:  No visual changes, runny nose, sore throat, mouth sores or tenderness.  Lungs: No shortness of breath or cough.  No hemoptysis. Cardiac:  No chest pain, palpitations, orthopnea, or PND. GI:  No nausea, vomiting, diarrhea, constipation, melena or hematochezia. GU:  No urgency, frequency, dysuria, or hematuria. Musculoskeletal:  No back pain.  No joint pain.  No muscle tenderness. Extremities:  No pain or swelling. Skin:  No rashes or skin changes. Neuro:  No headache, numbness or weakness, balance or coordination issues. Endocrine:  No diabetes, thyroid issues, hot flashes or night sweats. Psych:  No mood changes, depression or anxiety. Pain:  No focal pain. Review of systems:  All other systems reviewed and found to be negative.  Physical Exam: Blood pressure 143/95, pulse 96, temperature 98.9 F (37.2 C), temperature source Tympanic, resp. rate 18, weight 194 lb 0.1 oz (88 kg). GENERAL:  Well developed, well nourished, woman sitting comfortably in the exam room in no acute distress. MENTAL STATUS:  Alert and oriented to person, place and  time. HEAD:  Wearing a black cap.  Styled short brown hair.  Normocephalic, atraumatic, face symmetric, no Cushingoid features. EYES:  Brown eyes.  Pupils equal round and reactive to light and accomodation.  No conjunctivitis or scleral icterus. ENT:  Oropharynx clear without lesion.  Tongue normal. Mucous membranes moist.  RESPIRATORY:  Clear to auscultation without rales, wheezes or rhonchi. CARDIOVASCULAR:  Regular rate and rhythm without murmur, rub or gallop. ABDOMEN:  Soft, non-tender, with active bowel sounds, and no hepatosplenomegaly.  No  masses. SKIN:  No rashes, ulcers or lesions. EXTREMITIES: No edema, no skin discoloration or tenderness.  No palpable cords. LYMPH NODES: No palpable cervical, supraclavicular, axillary or inguinal adenopathy  NEUROLOGICAL: Unremarkable.  PSYCH:  Appropriate.  [facility] Labs:   CBC with diff on 09/21/2015 revealed a hematocrit of 34.7, hemoglobin 11.8, MCV 93, platelets 300,000, WBC 6400 with an ANC of 3600.  Comprehensive metabolic panel included a creatinine of 0.83, calcium 9.8, albumen 4.7, and protein 7.6.  Liver function tests were normal.  SPEP revealed no monoclonal protein.  CBC with diff on 11/02/2015 revealed a hematocrit of 33.3, hemoglobin 11.1, MCV 93, platelets 257,000, WBC 5100 with an ANC of 1900.  Comprehensive metabolic panel included a creatinine of 0.82, calcium 9.4, albumen 4.4, and protein 7.1.  Liver function tests were normal.   CBC with diff on 11/17/2015 revealed a hematocrit of 35.9, hemoglobin 11.9, MCV 94, platelets 284,000, WBC 6800 with an ANC of 3700.  Comprehensive metabolic panel included a creatinine of 0.84, calcium 9.2, albumen 4.4, and protein 7.5.  Liver function tests were normal.   CBC with diff on 12/01/2015 revealed a hematocrit of 35.8, hemoglobin 12.0, MCV 96, platelets 228,000, WBC 6100 with an ANC of 3200.  Comprehensive metabolic panel included a creatinine of 0.87, calcium 9.5, albumen 4.6, and protein 7.5.  Liver function tests were normal.   CBC with diff on 12/21/2015 revealed a hematocrit of 34.5, hemoglobin 11.9, MCV 93, platelets 249,000, WBC 6200 with an ANC of 3500.  Comprehensive metabolic panel included a creatinine of 0.90, calcium 9.6, albumen 4.7, and protein 7.4.  Liver function tests were normal.  SPEP revealed no monoclonal protein.  CBC with diff on 01/18/2016 revealed a hematocrit of 33.8, hemoglobin 11.4, MCV 92, platelets 262,000, WBC 6300 with an ANC of 3400.  Comprehensive metabolic panel included a creatinine of 0.79, calcium  9.5, albumen 4.4, and protein 7.4.  Liver function tests were normal.  SPEP revealed no monoclonal protein.  CBC with diff on 02/15/2016 revealed a hematocrit of 33.3, hemoglobin 11.5, MCV 92, platelets 271,000, WBC 6400 with an ANC of 3300.  Comprehensive metabolic panel included a creatinine of 0.98, calcium 9.6, albumen 4.8, and protein 7.7.  Liver function tests were normal.  Ferritin was 52, iron saturation 17%, TIBC 336, B12 305, folate 10.4, TSH 4.1, and retic 2.4%.   Assessment:  Annette Jimenez is a 55 y.o. female with stage III IgG lambda multiple myeloma. She presented with acute back pain and compression fracture of T2 on 02/02/2013. SPEP revealed 1.9 g/dL IgG lambda monoclonal protein. Kappa free light chains were 11.3 and lambda free light chains were 42.68. Bone survey on 02/26/2013 revealed L2 lytic lesion and C6 inferior endplate subtle concave compression deformity. Bone marrow biopsy revealed 11% plasma cells by aspirate and 30% plasma cells by CD138. Cytogenetics were normal. FISH showed FGFR3/IGH translocation t(4;14), monosomy 13, and gain of 1q21.   She received radiation to T2. She received RVD chemotherapy.  She underwent autologous stem cell transplant on 12/10/2013. Following transplant in 02/2014, she began Velcade every 2 weeks for maintenance therapy. She receives Zometa monthly (last 01/20/2016).   SPEP revealed no monoclonal protein on 01/26/2015, 04/18/2015, 08/10/2015, 11/17/2015, 12/21/2015, and 01/18/2016. Light chains were normal on 03/22/2015 and 04/18/2015.  She has received immunizations post transplant.  She received her second and third round of [place], [place], and ActHIB on 04/08/2015 and 06/17/2015.  She received her MMR and pneumococcal vaccine on 12/15/2015.  She continues every 2 week maintenance Velcade (last 01/06/2016).  She will complete 2 years of therapy in mid 02/2016.  Symptomatically, she denies any neuropathy in her hands  (resolved).  Exam is unremarkable.  Labs reveal a mild normocytic anemia.  Plan: 1.  Review labs from [facility].  Anemia work-up unrevealing.  Possible B12 deficiency (check MMA at next blood draw). 2.  Discuss neuropathy, resolved.  Continue to observe. 3.  Velcade and Zometa today. 4.  Complete [facility] slips for next 2 visits.  5.  RTC in 2 weeks for review of labs and Velcade. 6.  RTC in 4 weeks for MD assessment , review of labs, and Velcade + Zometa.   Lequita Asal, MD  02/17/2016, 2:28 PM

## 2016-02-17 NOTE — Progress Notes (Signed)
Patient is here for a follow up, no complaints and she is doing well.

## 2016-02-28 ENCOUNTER — Other Ambulatory Visit: Payer: Self-pay | Admitting: Hematology and Oncology

## 2016-03-01 ENCOUNTER — Encounter: Payer: Self-pay | Admitting: Hematology and Oncology

## 2016-03-02 ENCOUNTER — Inpatient Hospital Stay: Payer: 59 | Attending: Hematology and Oncology

## 2016-03-02 VITALS — BP 111/70 | HR 97 | Temp 99.0°F | Resp 18

## 2016-03-02 DIAGNOSIS — Z5111 Encounter for antineoplastic chemotherapy: Secondary | ICD-10-CM | POA: Diagnosis not present

## 2016-03-02 DIAGNOSIS — E119 Type 2 diabetes mellitus without complications: Secondary | ICD-10-CM | POA: Diagnosis not present

## 2016-03-02 DIAGNOSIS — I10 Essential (primary) hypertension: Secondary | ICD-10-CM | POA: Insufficient documentation

## 2016-03-02 DIAGNOSIS — C9001 Multiple myeloma in remission: Secondary | ICD-10-CM

## 2016-03-02 DIAGNOSIS — Z79899 Other long term (current) drug therapy: Secondary | ICD-10-CM | POA: Insufficient documentation

## 2016-03-02 DIAGNOSIS — C9002 Multiple myeloma in relapse: Secondary | ICD-10-CM | POA: Insufficient documentation

## 2016-03-02 DIAGNOSIS — D649 Anemia, unspecified: Secondary | ICD-10-CM | POA: Diagnosis not present

## 2016-03-02 MED ORDER — BORTEZOMIB CHEMO SQ INJECTION 3.5 MG (2.5MG/ML)
1.3000 mg/m2 | Freq: Once | INTRAMUSCULAR | Status: AC
  Administered 2016-03-02: 2.5 mg via SUBCUTANEOUS
  Filled 2016-03-02: qty 2.5

## 2016-03-02 MED ORDER — ONDANSETRON HCL 4 MG PO TABS
4.0000 mg | ORAL_TABLET | Freq: Once | ORAL | Status: AC
  Administered 2016-03-02: 4 mg via ORAL
  Filled 2016-03-02: qty 1

## 2016-03-14 ENCOUNTER — Encounter: Payer: Self-pay | Admitting: Hematology and Oncology

## 2016-03-16 ENCOUNTER — Other Ambulatory Visit: Payer: Self-pay | Admitting: *Deleted

## 2016-03-16 ENCOUNTER — Encounter: Payer: Self-pay | Admitting: Hematology and Oncology

## 2016-03-16 ENCOUNTER — Encounter (INDEPENDENT_AMBULATORY_CARE_PROVIDER_SITE_OTHER): Payer: Self-pay

## 2016-03-16 ENCOUNTER — Other Ambulatory Visit: Payer: Self-pay | Admitting: Hematology and Oncology

## 2016-03-16 ENCOUNTER — Inpatient Hospital Stay: Payer: 59

## 2016-03-16 ENCOUNTER — Inpatient Hospital Stay (HOSPITAL_BASED_OUTPATIENT_CLINIC_OR_DEPARTMENT_OTHER): Payer: 59 | Admitting: Hematology and Oncology

## 2016-03-16 VITALS — BP 123/82 | HR 112 | Temp 96.1°F | Resp 18 | Wt 194.9 lb

## 2016-03-16 DIAGNOSIS — C9001 Multiple myeloma in remission: Secondary | ICD-10-CM

## 2016-03-16 DIAGNOSIS — D649 Anemia, unspecified: Secondary | ICD-10-CM

## 2016-03-16 DIAGNOSIS — E119 Type 2 diabetes mellitus without complications: Secondary | ICD-10-CM

## 2016-03-16 DIAGNOSIS — I10 Essential (primary) hypertension: Secondary | ICD-10-CM

## 2016-03-16 DIAGNOSIS — C9002 Multiple myeloma in relapse: Secondary | ICD-10-CM | POA: Diagnosis not present

## 2016-03-16 DIAGNOSIS — Z79899 Other long term (current) drug therapy: Secondary | ICD-10-CM

## 2016-03-16 MED ORDER — SODIUM CHLORIDE 0.9 % IV SOLN
Freq: Once | INTRAVENOUS | Status: AC
  Administered 2016-03-16: 15:00:00 via INTRAVENOUS
  Filled 2016-03-16: qty 1000

## 2016-03-16 MED ORDER — ONDANSETRON HCL 4 MG PO TABS
4.0000 mg | ORAL_TABLET | Freq: Once | ORAL | Status: AC
  Administered 2016-03-16: 4 mg via ORAL
  Filled 2016-03-16: qty 1

## 2016-03-16 MED ORDER — BORTEZOMIB CHEMO SQ INJECTION 3.5 MG (2.5MG/ML)
1.3000 mg/m2 | Freq: Once | INTRAMUSCULAR | Status: AC
  Administered 2016-03-16: 2.5 mg via SUBCUTANEOUS
  Filled 2016-03-16: qty 2.5

## 2016-03-16 MED ORDER — ZOLEDRONIC ACID 4 MG/100ML IV SOLN
4.0000 mg | Freq: Once | INTRAVENOUS | Status: AC
  Administered 2016-03-16: 4 mg via INTRAVENOUS
  Filled 2016-03-16: qty 100

## 2016-03-16 NOTE — Progress Notes (Signed)
Patient is here today for follow up no complaints

## 2016-03-16 NOTE — Progress Notes (Signed)
[place] [facility] day:  03/16/16   Chief Complaint: Annette Jimenez is an 55 y.o. female with multiple myeloma status post autologous stem cell transplant who is seen for assessment prior to every other week Velcade.  HPI: The patient was last seen in the medical oncology clinic by on 02/17/2016.  At that time, she denied any neuropathy in her hands (resolved).  Exam was unremarkable.  Labs revealed a mild normocytic anemia.  Anemia work-up was unrevealing.  She received Velcade and Zometa.  She received Velcade on 03/02/2016.    Symptomatically, she continues to do well.  She denies any complaints.   Past Medical History:  Diagnosis Date  . Cancer [facility])    Multiple Myeloma  . Diabetes mellitus without complication ([place])    pre diabetic  . Hypertension     Past Surgical History:  Procedure Laterality Date  . LIMBAL STEM CELL TRANSPLANT  2015  . TUBAL LIGATION Left    Pt believes in was 1996    Family History  Problem Relation Age of Onset  . Cancer Mother   . Hypertension Mother   . Cancer Sister   . Diabetes Sister   . Crohn's disease Sister     Social History:  reports that she has never smoked. She has never used smokeless tobacco. She reports that she does not drink alcohol or use drugs.  The patient is alone today  Allergies:  Allergies  Allergen Reactions  . Bee Venom Swelling    Current Medications: Current Outpatient Prescriptions  Medication Sig Dispense Refill  . amLODipine (NORVASC) 5 MG tablet Take 5 mg by mouth daily.     No current facility-administered medications for this visit.    Facility-Administered Medications Ordered in Other Visits  Medication Dose Route Frequency Provider Last Rate Last Dose  . 0.9 %  sodium chloride infusion   Intravenous Continuous Lequita Asal, MD   Stopped at 07/15/15 1507  . 0.9 %  sodium chloride infusion   Intravenous Once Lequita Asal, MD      . bortezomib SQ  (VELCADE) chemo injection 2.5 mg  1.3 mg/m2 (Order-Specific) Subcutaneous Once Lequita Asal, MD       Review of Systems:  GENERAL:  Feels good.  No fevers or sweats.  Weight stable. PERFORMANCE STATUS (ECOG):  0 HEENT:  No visual changes, runny nose, sore throat, mouth sores or tenderness.  Lungs: No shortness of breath or cough.  No hemoptysis. Cardiac:  No chest pain, palpitations, orthopnea, or PND. GI:  No nausea, vomiting, diarrhea, constipation, melena or hematochezia. GU:  No urgency, frequency, dysuria, or hematuria. Musculoskeletal:  No back pain.  No joint pain.  No muscle tenderness. Extremities:  No pain or swelling. Skin:  No rashes or skin changes. Neuro:  No headache, numbness or weakness, balance or coordination issues. Endocrine:  No diabetes, thyroid issues, hot flashes or night sweats. Psych:  Feels great (last day if chemotherapy).  No mood changes, depression or anxiety. Pain:  No focal pain. Review of systems:  All other systems reviewed and found to be negative.  Physical Exam: Blood pressure 123/82, pulse (!) 112, temperature (!) 96.1 F (35.6 C), temperature source Tympanic, resp. rate 18, weight 194 lb 14.2 oz (88.4 kg). GENERAL:  Well developed, well nourished, woman sitting comfortably in the exam room in no acute distress. MENTAL STATUS:  Alert and oriented to person, place and time. HEAD:  Styled short brown hair.  Normocephalic, atraumatic, face symmetric, no Cushingoid features. EYES:  Brown eyes.  Pupils equal round and reactive to light and accomodation.  No conjunctivitis or scleral icterus. ENT:  Oropharynx clear without lesion.  Tongue normal. Mucous membranes moist.  RESPIRATORY:  Clear to auscultation without rales, wheezes or rhonchi. CARDIOVASCULAR:  Regular rate and rhythm without murmur, rub or gallop. ABDOMEN:  Soft, non-tender, with active bowel sounds, and no hepatosplenomegaly.  No masses. SKIN:  No rashes, ulcers or  lesions. EXTREMITIES: No edema, no skin discoloration or tenderness.  No palpable cords. LYMPH NODES: No palpable cervical, supraclavicular, axillary or inguinal adenopathy  NEUROLOGICAL: Unremarkable.  PSYCH:  Appropriate.  [facility] Labs:   CBC with diff on 09/21/2015 revealed a hematocrit of 34.7, hemoglobin 11.8, MCV 93, platelets 300,000, WBC 6400 with an ANC of 3600.  Comprehensive metabolic panel included a creatinine of 0.83, calcium 9.8, albumen 4.7, and protein 7.6.  Liver function tests were normal.  SPEP revealed no monoclonal protein.  CBC with diff on 11/02/2015 revealed a hematocrit of 33.3, hemoglobin 11.1, MCV 93, platelets 257,000, WBC 5100 with an ANC of 1900.  Comprehensive metabolic panel included a creatinine of 0.82, calcium 9.4, albumen 4.4, and protein 7.1.  Liver function tests were normal.   CBC with diff on 11/17/2015 revealed a hematocrit of 35.9, hemoglobin 11.9, MCV 94, platelets 284,000, WBC 6800 with an ANC of 3700.  Comprehensive metabolic panel included a creatinine of 0.84, calcium 9.2, albumen 4.4, and protein 7.5.  Liver function tests were normal.   CBC with diff on 12/01/2015 revealed a hematocrit of 35.8, hemoglobin 12.0, MCV 96, platelets 228,000, WBC 6100 with an ANC of 3200.  Comprehensive metabolic panel included a creatinine of 0.87, calcium 9.5, albumen 4.6, and protein 7.5.  Liver function tests were normal.   CBC with diff on 12/21/2015 revealed a hematocrit of 34.5, hemoglobin 11.9, MCV 93, platelets 249,000, WBC 6200 with an ANC of 3500.  Comprehensive metabolic panel included a creatinine of 0.90, calcium 9.6, albumen 4.7, and protein 7.4.  Liver function tests were normal.  SPEP revealed no monoclonal protein.  CBC with diff on 01/18/2016 revealed a hematocrit of 33.8, hemoglobin 11.4, MCV 92, platelets 262,000, WBC 6300 with an ANC of 3400.  Comprehensive metabolic panel included a creatinine of 0.79, calcium 9.5, albumen 4.4, and protein 7.4.   Liver function tests were normal.  SPEP revealed no monoclonal protein.  CBC with diff on 02/15/2016 revealed a hematocrit of 33.3, hemoglobin 11.5, MCV 92, platelets 271,000, WBC 6400 with an ANC of 3300.  Comprehensive metabolic panel included a creatinine of 0.98, calcium 9.6, albumen 4.8, and protein 7.7.  Liver function tests were normal.  Ferritin was 52, iron saturation 17%, TIBC 336, B12 305, folate 10.4, TSH 4.1, and retic 2.4%.  CBC with diff on 03/14/2016 revealed a hematocrit of 35.5, hemoglobin 11.6, MCV 93, platelets 292,000, WBC 6600 with an ANC of 3100.  Comprehensive metabolic panel included a creatinine of 0.84, calcium 10.2, albumen 4.6, and protein 7.6.  Liver function tests were normal. SPEP revealed no monoclonal protein.   Assessment:  Annette Jimenez is a 55 y.o. female with stage III IgG lambda multiple myeloma. She presented with acute back pain and compression fracture of T2 on 02/02/2013. SPEP revealed 1.9 g/dL IgG lambda monoclonal protein. Kappa free light chains were 11.3 and lambda free light chains were 42.68. Bone survey on 02/26/2013 revealed L2 lytic lesion and C6 inferior endplate subtle concave compression deformity. Bone marrow  biopsy revealed 11% plasma cells by aspirate and 30% plasma cells by CD138. Cytogenetics were normal. FISH showed FGFR3/IGH translocation t(4;14), monosomy 13, and gain of 1q21.   She received radiation to T2. She received RVD chemotherapy. She underwent autologous stem cell transplant on 12/10/2013. Following transplant in 02/2014, she began Velcade every 2 weeks for maintenance therapy. She receives Zometa monthly (last 02/17/2016).   SPEP revealed no monoclonal protein on 01/26/2015, 04/18/2015, 08/10/2015, 11/17/2015, 12/21/2015, and 01/18/2016. Light chains were normal on 03/22/2015 and 04/18/2015.  She has received immunizations post transplant.  She received her second and third round of [place], [place], and ActHIB  on 04/08/2015 and 06/17/2015.  She received her MMR and pneumococcal vaccine on 12/15/2015.  She continues every 2 week maintenance Velcade (last 03/02/2016).  She completes 2 years of therapy today.  Symptomatically, she denies any neuropathy.  Exam is normal.  Labs are normal.  Plan: 1.  Review labs from [facility]. 2.  Velcade and Zometa today. 3.  Complete [facility] slips for next visit.  4.  RTC in 3 months for MD assessment and review of labs.   Lequita Asal, MD  03/16/2016, 2:08 PM

## 2016-04-26 DIAGNOSIS — E782 Mixed hyperlipidemia: Secondary | ICD-10-CM | POA: Insufficient documentation

## 2016-06-15 ENCOUNTER — Ambulatory Visit: Payer: 59 | Admitting: Hematology and Oncology

## 2016-07-26 ENCOUNTER — Encounter: Payer: Self-pay | Admitting: Hematology and Oncology

## 2016-08-13 ENCOUNTER — Encounter: Payer: Self-pay | Admitting: Hematology and Oncology

## 2016-08-17 ENCOUNTER — Encounter: Payer: Self-pay | Admitting: Hematology and Oncology

## 2016-08-17 ENCOUNTER — Inpatient Hospital Stay: Payer: 59 | Attending: Hematology and Oncology | Admitting: Hematology and Oncology

## 2016-08-17 VITALS — BP 139/85 | HR 103 | Temp 98.3°F | Resp 18 | Wt 194.9 lb

## 2016-08-17 DIAGNOSIS — C9001 Multiple myeloma in remission: Secondary | ICD-10-CM | POA: Insufficient documentation

## 2016-08-17 DIAGNOSIS — Z809 Family history of malignant neoplasm, unspecified: Secondary | ICD-10-CM | POA: Insufficient documentation

## 2016-08-17 DIAGNOSIS — Z923 Personal history of irradiation: Secondary | ICD-10-CM | POA: Insufficient documentation

## 2016-08-17 DIAGNOSIS — I10 Essential (primary) hypertension: Secondary | ICD-10-CM | POA: Insufficient documentation

## 2016-08-17 DIAGNOSIS — Z9221 Personal history of antineoplastic chemotherapy: Secondary | ICD-10-CM | POA: Diagnosis not present

## 2016-08-17 DIAGNOSIS — Z79899 Other long term (current) drug therapy: Secondary | ICD-10-CM | POA: Diagnosis not present

## 2016-08-17 DIAGNOSIS — Z9484 Stem cells transplant status: Secondary | ICD-10-CM | POA: Diagnosis not present

## 2016-08-17 NOTE — Progress Notes (Signed)
Patient offers no concerns today. 

## 2016-08-17 NOTE — Progress Notes (Signed)
[place] [facility] day:  08/17/16   Chief Complaint: Annette Jimenez is an 56 y.o. female with multiple myeloma status post autologous stem cell transplant who is seen for 3 month assessment.  HPI: The patient was last seen in the medical oncology clinic on 03/16/2016.  At that time, she was doing well.  She denied any complaints.  She received her last Zometa and Velcade.  [facility] labs on 08/13/2016 revealed a normal CBC with diff and CMP. SPEP revealed no monoclonal protein.  Kappa free light chains were 15.3, lambda free light chains 13.5 with a ratio 1.13 (normal).  During the interim, she has done well.  She notes an ache now and then in her right leg.  She denies any lower extremity swelling or shortness of breath.   Past Medical History:  Diagnosis Date  . Cancer [facility])    Multiple Myeloma  . Diabetes mellitus without complication ([place])    pre diabetic  . Hypertension     Past Surgical History:  Procedure Laterality Date  . LIMBAL STEM CELL TRANSPLANT  2015  . TUBAL LIGATION Left    Pt believes in was 1996    Family History  Problem Relation Age of Onset  . Cancer Mother   . Hypertension Mother   . Cancer Sister   . Diabetes Sister   . Crohn's disease Sister     Social History:  reports that she has never smoked. She has never used smokeless tobacco. She reports that she does not drink alcohol or use drugs.  She lives in [place].  The patient is alone today  Allergies:  Allergies  Allergen Reactions  . Bee Venom Swelling    Current Medications: Current Outpatient Prescriptions  Medication Sig Dispense Refill  . amLODipine (NORVASC) 5 MG tablet Take 5 mg by mouth daily.    . Multiple Vitamin (MULTI-VITAMINS) TABS Take by mouth.     No current facility-administered medications for this visit.    Facility-Administered Medications Ordered in Other Visits  Medication Dose Route Frequency Provider Last Rate Last Dose  .  0.9 %  sodium chloride infusion   Intravenous Continuous Lequita Asal, MD   Stopped at 07/15/15 1507  . 0.9 %  sodium chloride infusion   Intravenous Once Lequita Asal, MD      . bortezomib SQ (VELCADE) chemo injection 2.5 mg  1.3 mg/m2 (Order-Specific) Subcutaneous Once Lequita Asal, MD       Review of Systems:  GENERAL:  Feels good.  No fevers or sweats.  Weight stable. PERFORMANCE STATUS (ECOG):  0 HEENT:  No visual changes, runny nose, sore throat, mouth sores or tenderness.  Lungs: No shortness of breath or cough.  No hemoptysis. Cardiac:  No chest pain, palpitations, orthopnea, or PND. GI:  No nausea, vomiting, diarrhea, constipation, melena or hematochezia. GU:  No urgency, frequency, dysuria, or hematuria. Musculoskeletal:  No back pain.  No joint pain.  No muscle tenderness. Extremities:  Right leg ache now and then.  No swelling. Skin:  No rashes or skin changes. Neuro:  No headache, numbness or weakness, balance or coordination issues. Endocrine:  No diabetes, thyroid issues, hot flashes or night sweats. Psych:  No mood changes, depression or anxiety. Pain:  No focal pain. Review of systems:  All other systems reviewed and found to be negative.  Physical Exam: Blood pressure 139/85, pulse (!) 103, temperature 98.3 F (36.8 C), temperature source Tympanic, resp. rate  18, weight 194 lb 14.2 oz (88.4 kg). GENERAL:  Well developed, well nourished, woman sitting comfortably in the exam room in no acute distress. MENTAL STATUS:  Alert and oriented to person, place and time. HEAD:  Styled short brown hair.  Normocephalic, atraumatic, face symmetric, no Cushingoid features. EYES:  Brown eyes.  Pupils equal round and reactive to light and accomodation.  No conjunctivitis or scleral icterus. ENT:  Oropharynx clear without lesion.  Tongue normal. Mucous membranes moist.  RESPIRATORY:  Clear to auscultation without rales, wheezes or rhonchi. CARDIOVASCULAR:  Regular  rate and rhythm without murmur, rub or gallop. ABDOMEN:  Soft, non-tender, with active bowel sounds, and no hepatosplenomegaly.  No masses. SKIN:  No rashes, ulcers or lesions. EXTREMITIES: No edema, no skin discoloration or tenderness.  No palpable cords. LYMPH NODES: No palpable cervical, supraclavicular, axillary or inguinal adenopathy  NEUROLOGICAL: Unremarkable.  PSYCH:  Appropriate.  [facility] Labs:   CBC with diff on 08/13/2016 revealed a hematocrit 36.9, hemoglobin 12.6, MCV 93, platelets 293,000, white count 6800 with an ANC of 3500. CMP included a sodium of 136, potassium 4.5, creatinine 0.97, calcium 10, protein 7.8, bilirubin 0.2, SGOT 15, SGPT 16, and alkaline phosphatase 78. SPEP revealed no monoclonal protein.  Kappa free light chains were 15.3, lambda free light chains 13.5 with a ratio 1.13.   Assessment:  Annette Jimenez is a 56 y.o. female with stage III IgG lambda multiple myeloma. She presented with acute back pain and compression fracture of T2 on 02/02/2013. SPEP revealed 1.9 g/dL IgG lambda monoclonal protein. Kappa free light chains were 11.3 and lambda free light chains were 42.68. Bone survey on 02/26/2013 revealed L2 lytic lesion and C6 inferior endplate subtle concave compression deformity. Bone marrow biopsy revealed 11% plasma cells by aspirate and 30% plasma cells by CD138. Cytogenetics were normal. FISH showed FGFR3/IGH translocation t(4;14), monosomy 13, and gain of 1q21.   She received radiation to T2. She received RVD chemotherapy. She underwent autologous stem cell transplant on 12/10/2013. Following transplant in 02/2014, she began Velcade every 2 weeks for maintenance therapy. She receives Zometa monthly (last 03/16/2016).   SPEP revealed no monoclonal protein on 01/26/2015, 04/18/2015, 08/10/2015, 11/17/2015, 12/21/2015, and 01/18/2016. Light chains were normal on 03/22/2015 and 04/18/2015.  She has received immunizations post transplant.  She  received her second and third round of [place], [place], and ActHIB on 04/08/2015 and 06/17/2015.  She received her MMR and pneumococcal vaccine on 12/15/2015.  She completed every 2 week maintenance Velcade for 2 years (last 03/16/2016).  Symptomatically, she denies any neuropathy.  Exam is normal.  Labs are normal.  Plan: 1.  Review labs from [facility]. 2.  Complete [facility] slips for next visit.  3.  RTC in 3 months for MD assessment and review of labs.   Lequita Asal, MD  08/17/2016, 3:37 PM

## 2016-09-16 IMAGING — CR METASTATIC BONE SURVEY
1 series · 10 of 10 positions shown · non-contrast
Comparison: 08/07/2013.

CLINICAL DATA: Myeloma.

EXAM:
METASTATIC BONE SURVEY

[Series 1: w cervical spine lat · 0.14mm/px · 10 of 22 slices shown]
[im 1/22]
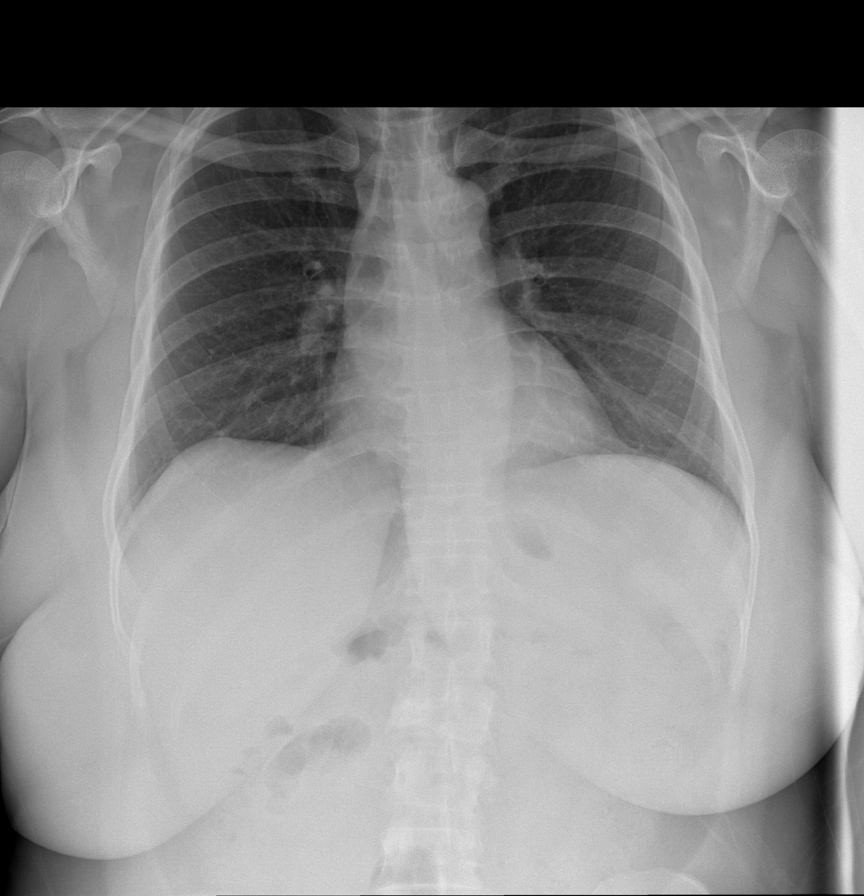
[im 3/22]
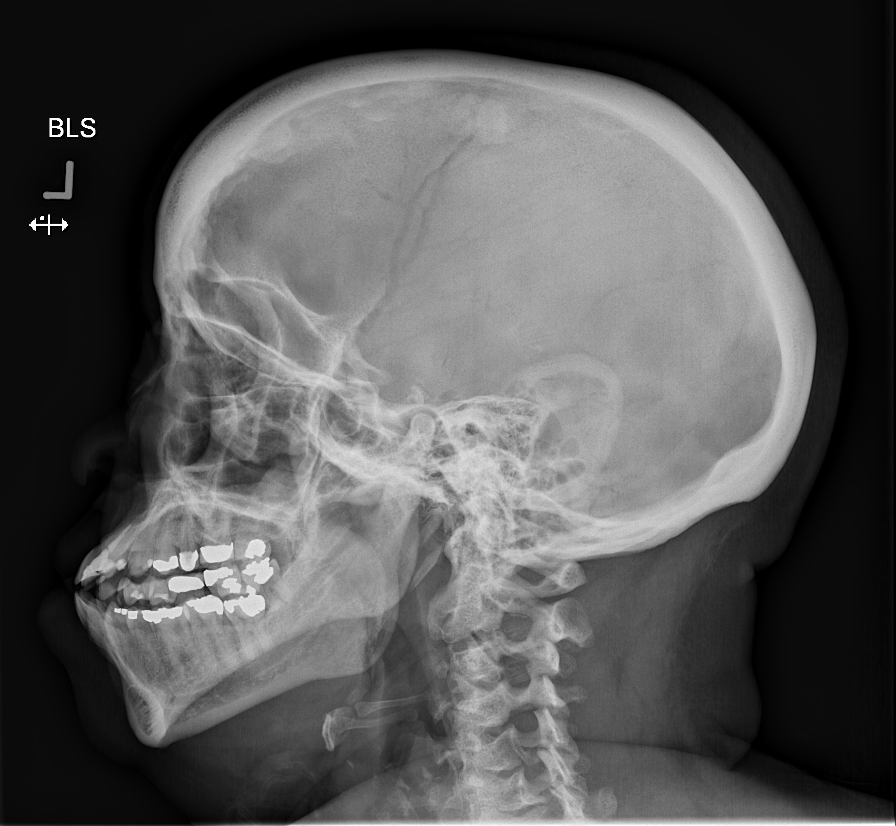
[im 5/22]
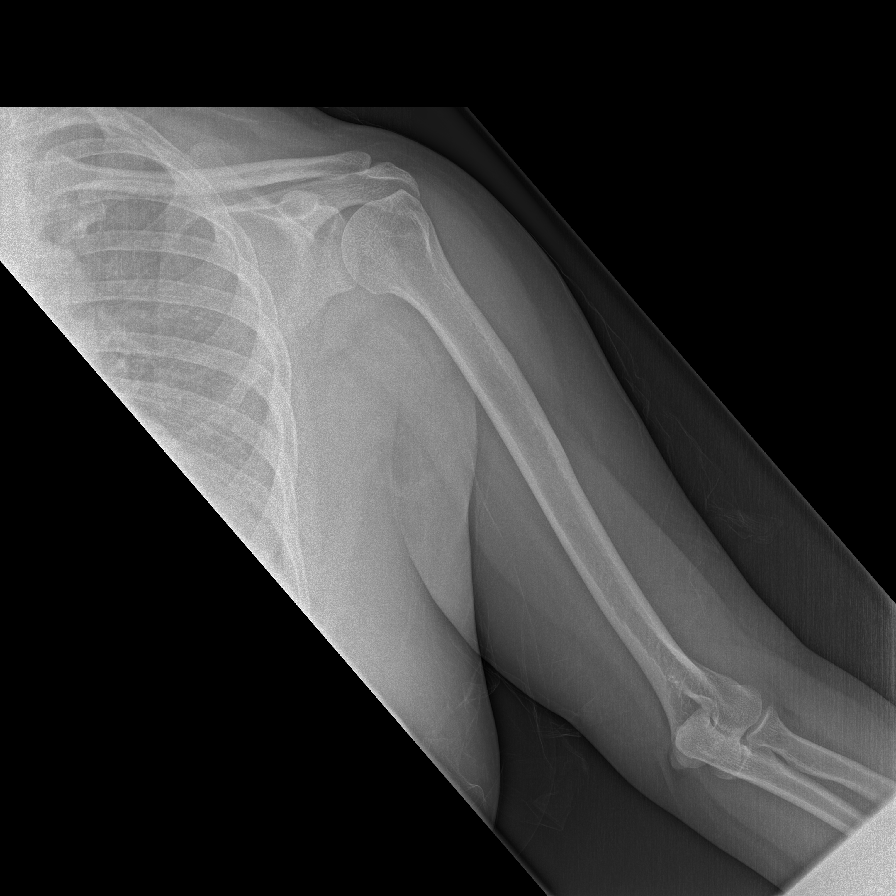
[im 8/22]
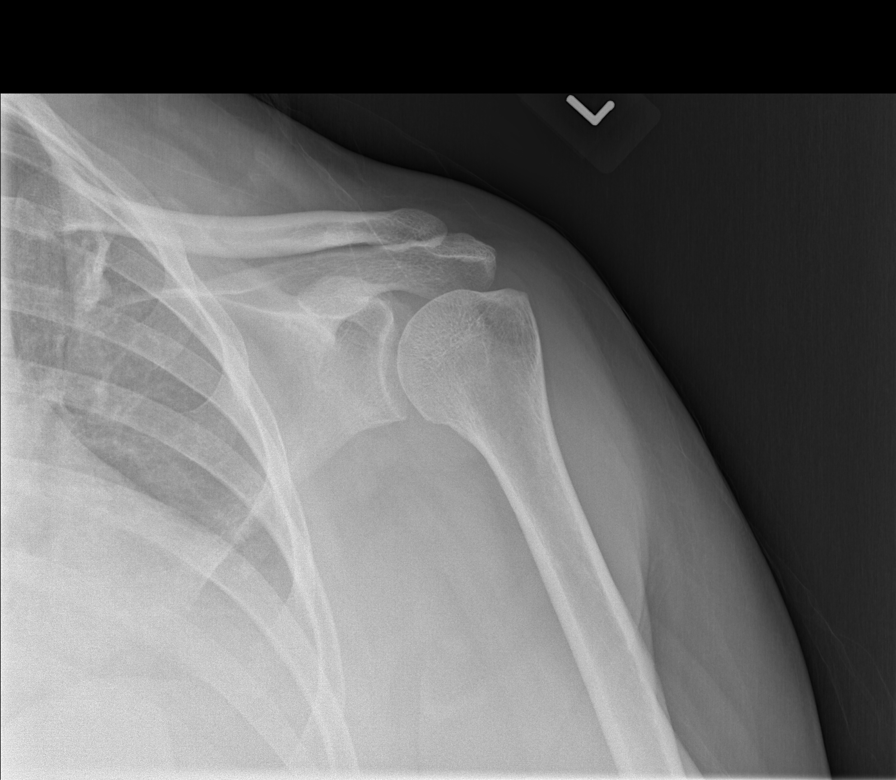
[im 10/22]
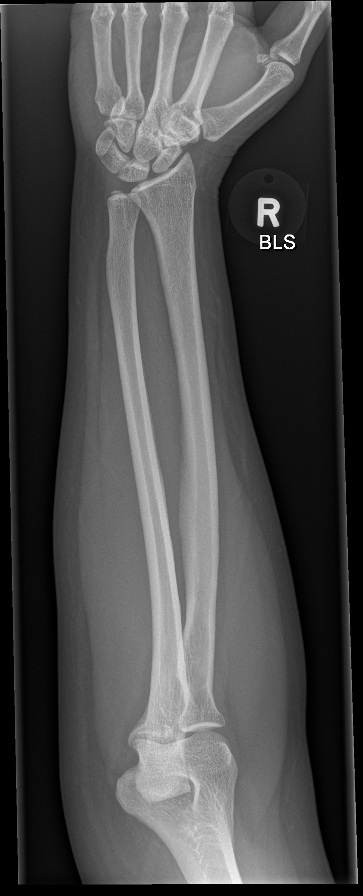
[im 12/22]
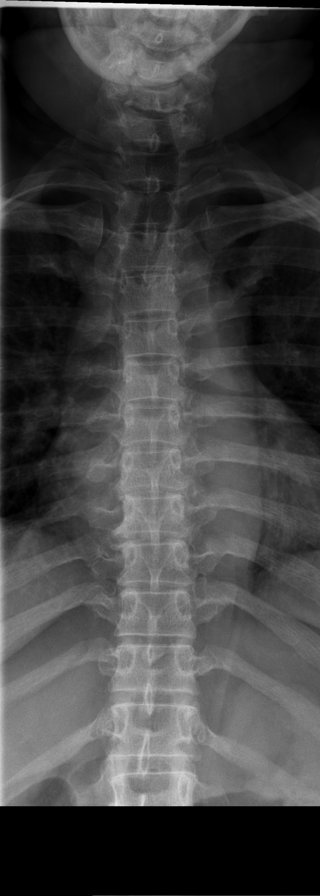
[im 15/22]
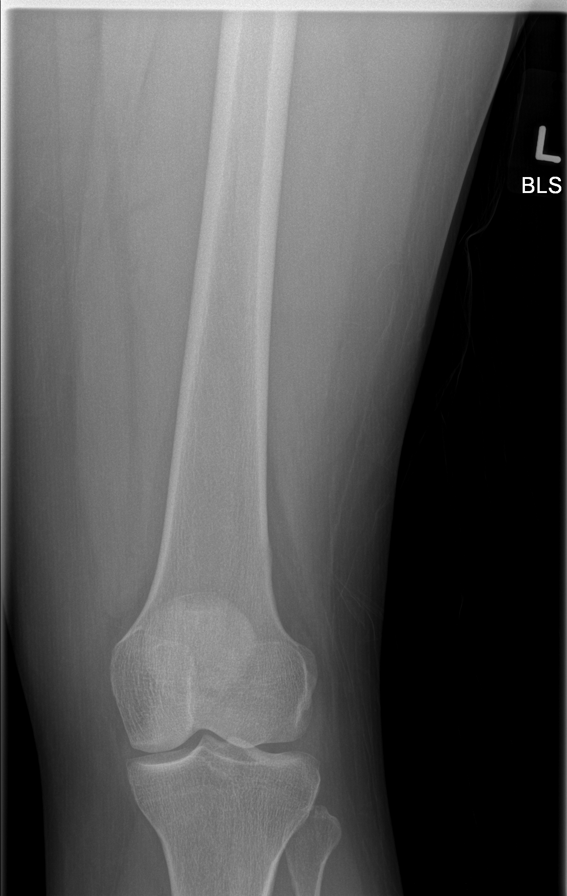
[im 17/22]
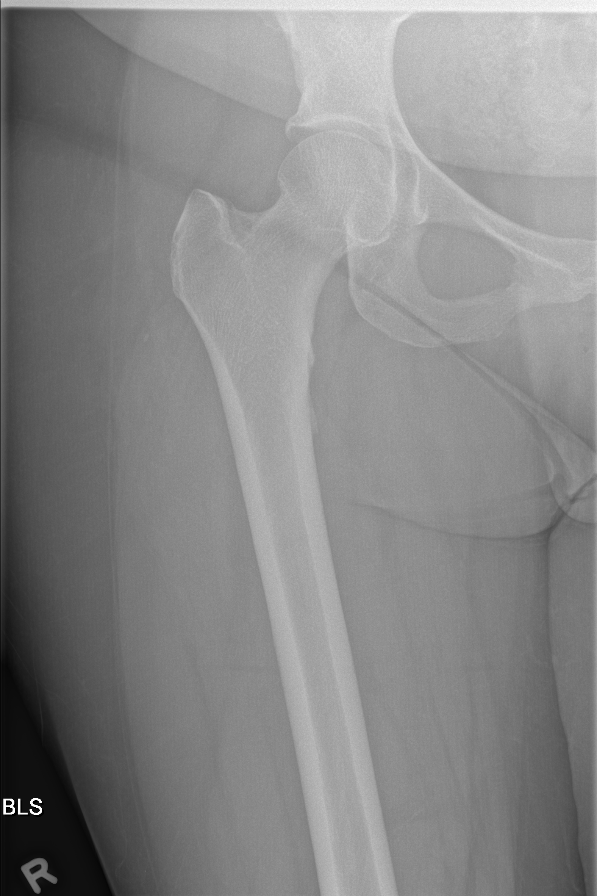
[im 19/22]
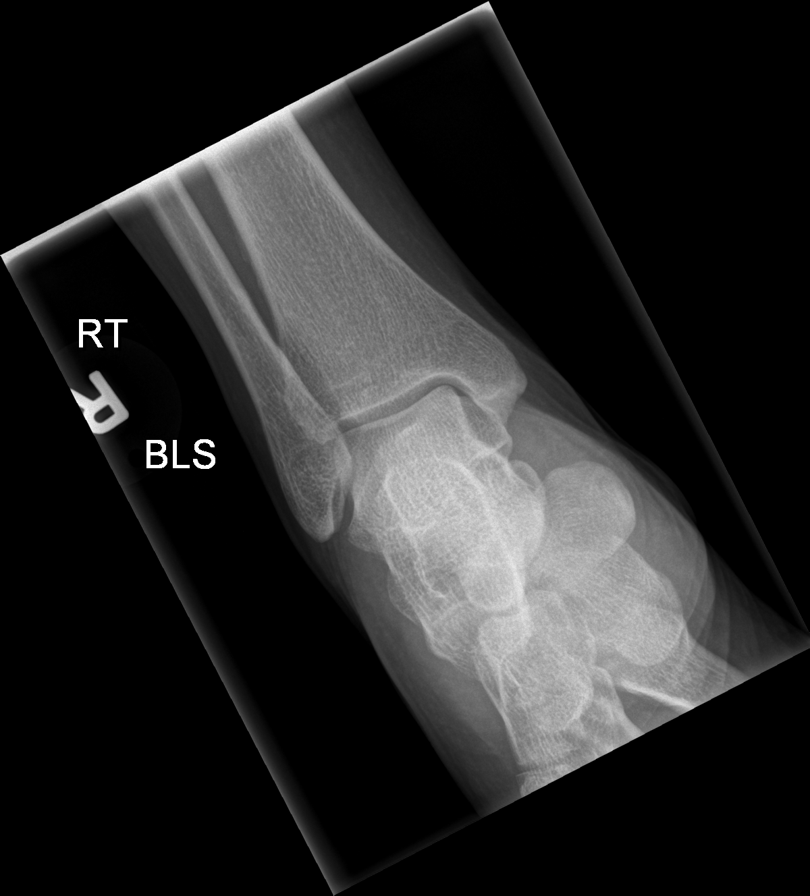
[im 22/22]
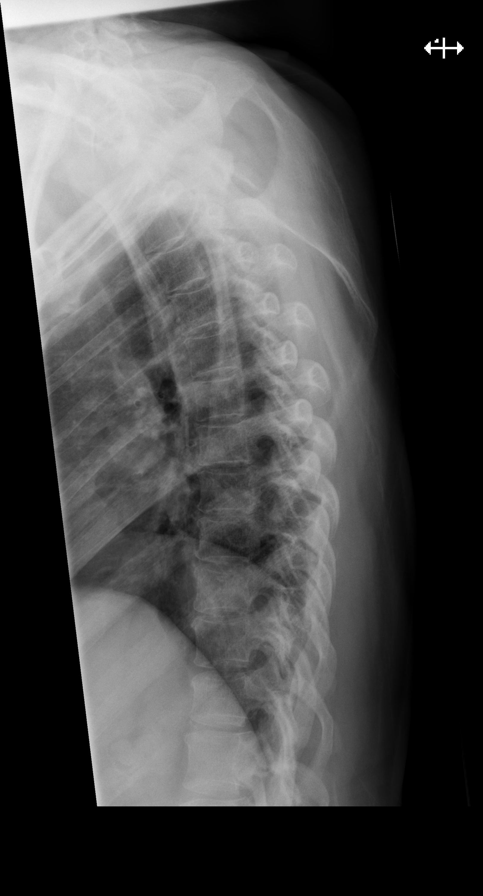

[10 of 10 positions shown; findings below may reference images not displayed]

FINDINGS: No focal new lytic or sclerotic lesions noted to suggest myeloma or
metastatic disease. Stable chronic compression of L2.
IMPRESSION: Stable metastatic bone survey. No focal new lytic or sclerotic
lesion is noted to suggest myeloma or metastatic disease. There is a
stable chronic compression of L2 vertebral body .

## 2016-11-15 ENCOUNTER — Ambulatory Visit: Payer: 59 | Admitting: Hematology and Oncology

## 2016-11-15 DIAGNOSIS — Z9484 Stem cells transplant status: Secondary | ICD-10-CM | POA: Insufficient documentation

## 2017-11-16 IMAGING — CR DG BONE SURVEY MET
1 series · 8 of 8 positions shown · non-contrast
Comparison: 07/29/2014

CLINICAL DATA: Multiple myeloma in relapse, post autologous stem
cell transplant

EXAM:
METASTATIC BONE SURVEY

[Series 1: dg bone survey met · 0.14mm/px · 8 of 23 slices shown]
[im 1/23]
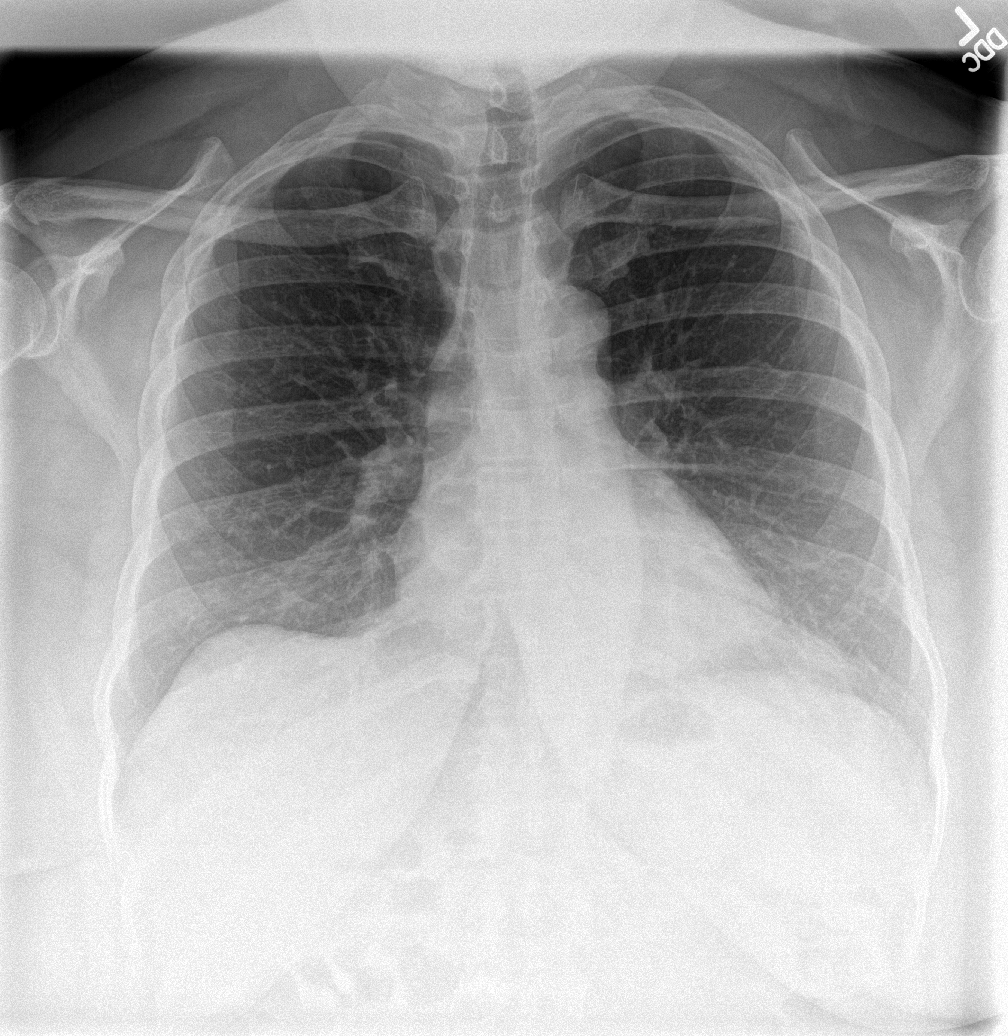
[im 4/23]
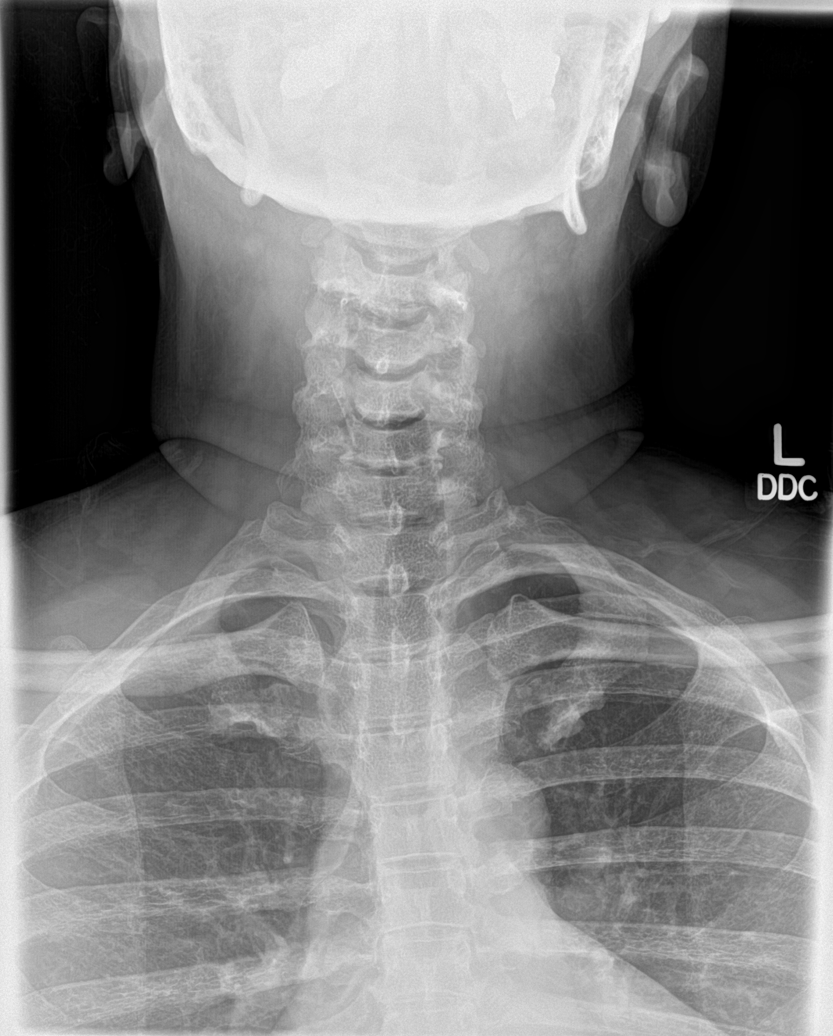
[im 7/23]
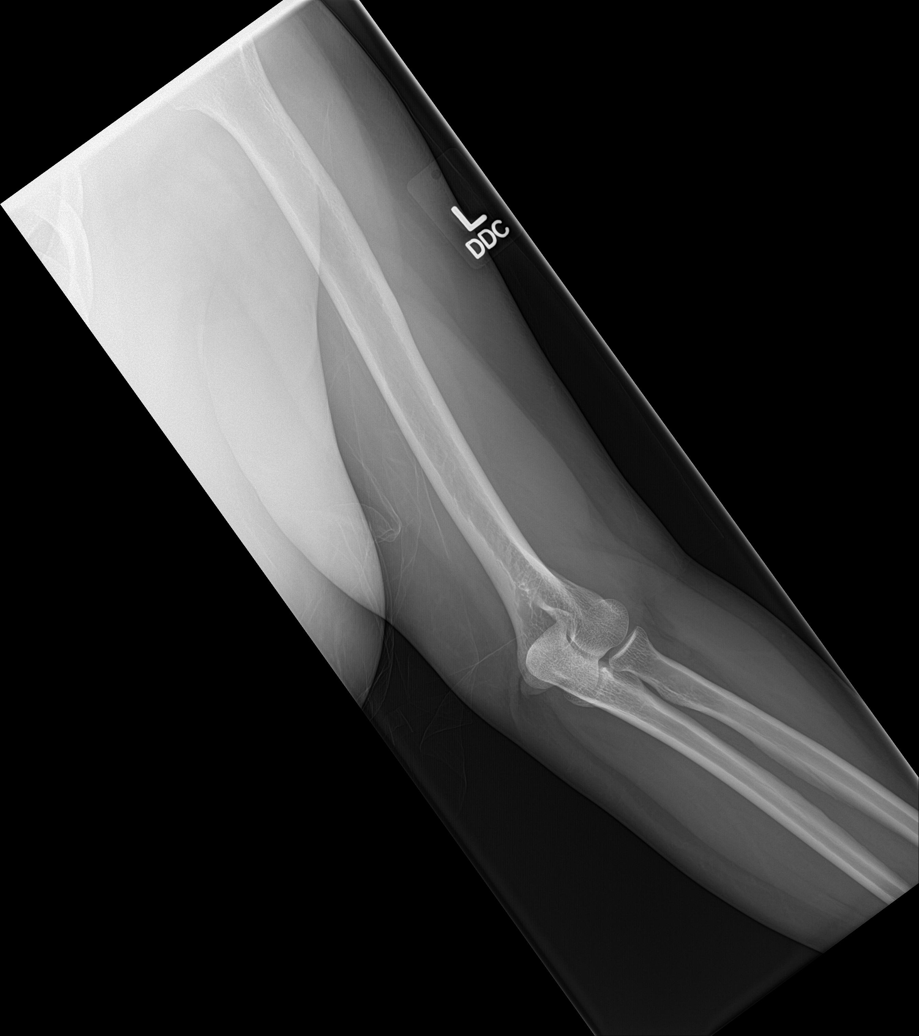
[im 10/23]
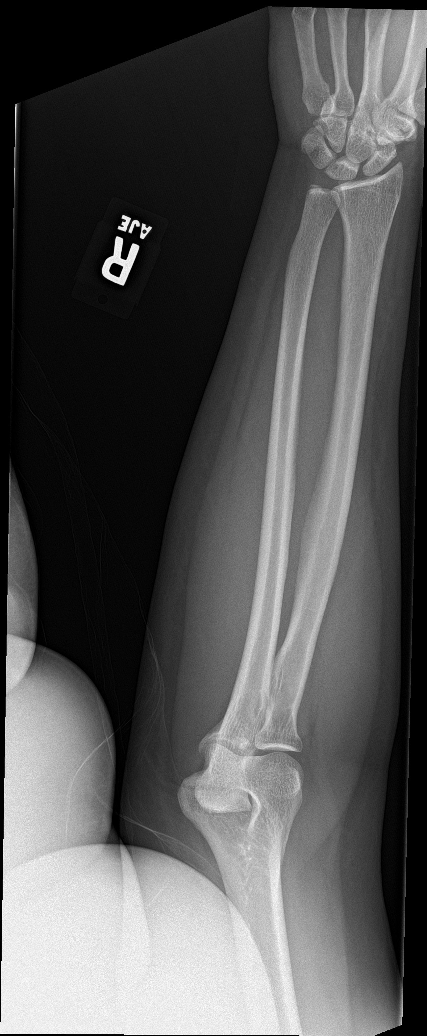
[im 13/23]
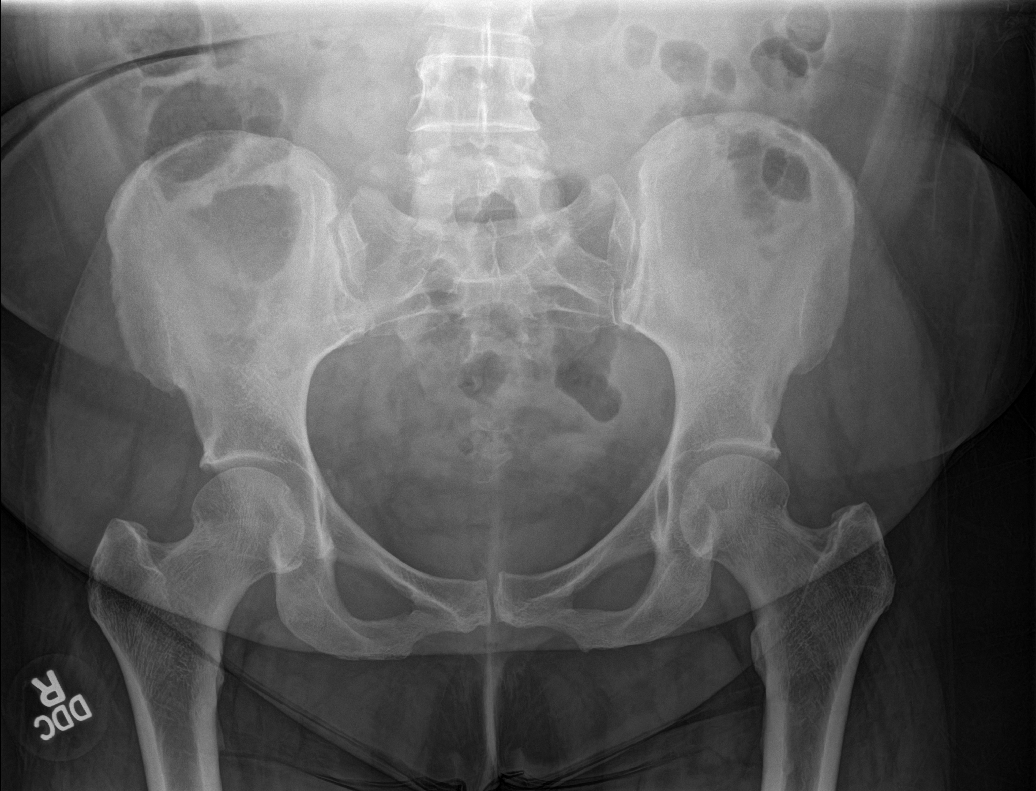
[im 16/23]
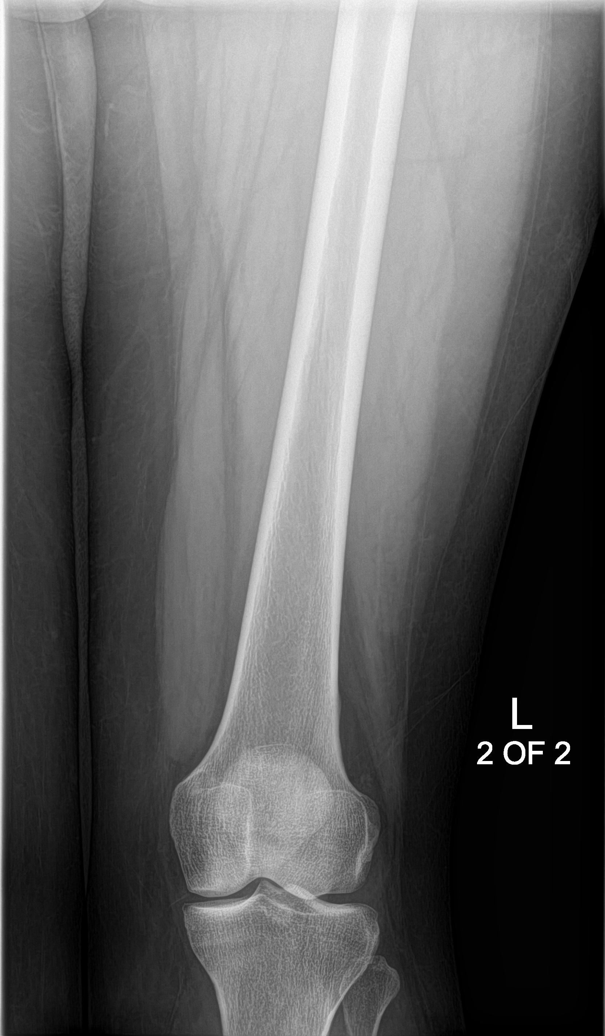
[im 19/23]
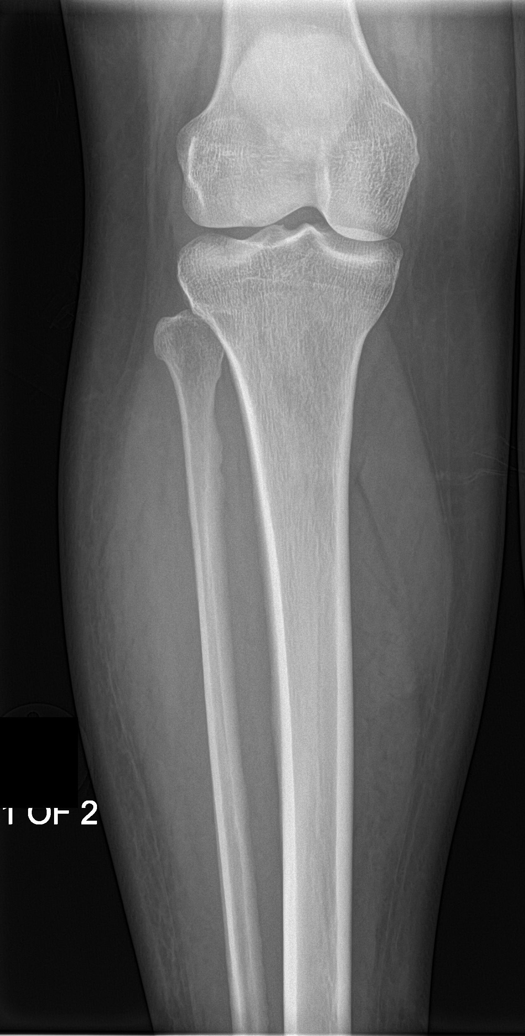
[im 23/23]
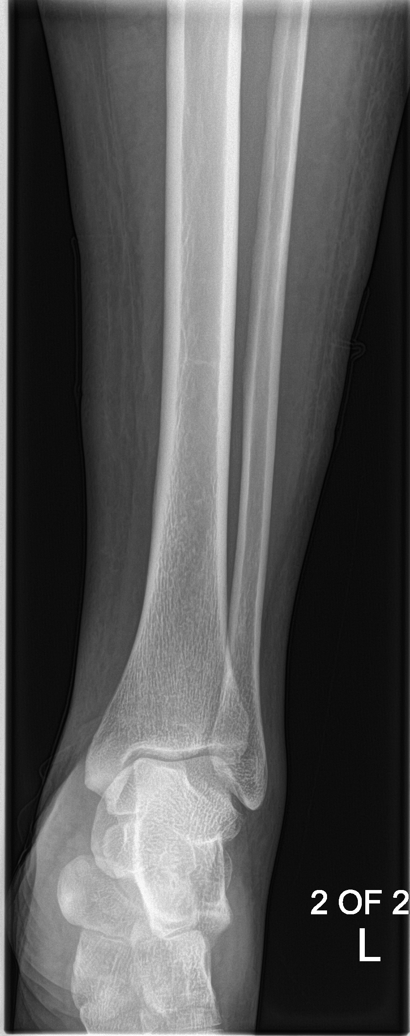

[8 of 8 positions shown; findings below may reference images not displayed]

FINDINGS: Normal heart size, mediastinal contours and pulmonary vascularity.

Lungs clear.

No pleural effusion or pneumothorax.

Disc space narrowing with endplate spur formation at C6-C7.

Inferior RIGHT acromial spur formation.

Chronic mixed lytic and sclerotic process within the RIGHT lateral
aspect of the L2 vertebral body unchanged, demonstrating mild RIGHT
lateral height loss similar to previous exam.

No additional lytic or sclerotic lesions are identified within
osseous structures.

Remainder of osseous survey is unremarkable.
IMPRESSION: Chronic mixed lytic and sclerotic process within RIGHT lateral
aspect of L2 vertebral body.

No new focal osseous abnormalities are identified to suggest
multiple myeloma or metastatic disease.

## 2017-12-12 ENCOUNTER — Encounter: Payer: Self-pay | Admitting: Advanced Practice Midwife

## 2017-12-12 ENCOUNTER — Ambulatory Visit (INDEPENDENT_AMBULATORY_CARE_PROVIDER_SITE_OTHER): Payer: Managed Care, Other (non HMO) | Admitting: Advanced Practice Midwife

## 2017-12-12 VITALS — BP 128/68 | HR 88 | Ht 61.0 in | Wt 192.0 lb

## 2017-12-12 DIAGNOSIS — Z0000 Encounter for general adult medical examination without abnormal findings: Secondary | ICD-10-CM | POA: Diagnosis not present

## 2017-12-12 DIAGNOSIS — E119 Type 2 diabetes mellitus without complications: Secondary | ICD-10-CM | POA: Insufficient documentation

## 2017-12-12 DIAGNOSIS — Z01419 Encounter for gynecological examination (general) (routine) without abnormal findings: Secondary | ICD-10-CM | POA: Diagnosis not present

## 2017-12-12 DIAGNOSIS — Z1231 Encounter for screening mammogram for malignant neoplasm of breast: Secondary | ICD-10-CM

## 2017-12-12 DIAGNOSIS — Z1239 Encounter for other screening for malignant neoplasm of breast: Secondary | ICD-10-CM

## 2017-12-12 NOTE — Patient Instructions (Signed)
Mediterranean Diet A Mediterranean diet refers to food and lifestyle choices that are based on the traditions of countries located on the [place]. This way of eating has been shown to help prevent certain conditions and improve outcomes for people who have chronic diseases, like kidney disease and heart disease. What are tips for following this plan? Lifestyle  Cook and eat meals together with your family, when possible.  Drink enough fluid to keep your urine clear or pale yellow.  Be physically active every day. This includes: ? Aerobic exercise like running or swimming. ? Leisure activities like gardening, walking, or housework.  Get 7-8 hours of sleep each night.  If recommended by your health care provider, drink red wine in moderation. This means 1 glass a day for nonpregnant women and 2 glasses a day for men. A glass of wine equals 5 oz (150 mL). Reading food labels  Check the serving size of packaged foods. For foods such as rice and pasta, the serving size refers to the amount of cooked product, not dry.  Check the total fat in packaged foods. Avoid foods that have saturated fat or trans fats.  Check the ingredients list for added sugars, such as corn syrup. Shopping  At the grocery store, buy most of your food from the areas near the walls of the store. This includes: ? Fresh fruits and vegetables (produce). ? Grains, beans, nuts, and seeds. Some of these may be available in unpackaged forms or large amounts (in bulk). ? Fresh seafood. ? Poultry and eggs. ? Low-fat dairy products.  Buy whole ingredients instead of prepackaged foods.  Buy fresh fruits and vegetables in-season from local farmers markets.  Buy frozen fruits and vegetables in resealable bags.  If you do not have access to quality fresh seafood, buy precooked frozen shrimp or canned fish, such as tuna, salmon, or sardines.  Buy small amounts of raw or cooked vegetables, salads, or olives from the  deli or salad bar at your store.  Stock your pantry so you always have certain foods on hand, such as olive oil, canned tuna, canned tomatoes, rice, pasta, and beans. Cooking  Cook foods with extra-virgin olive oil instead of using butter or other vegetable oils.  Have meat as a side dish, and have vegetables or grains as your main dish. This means having meat in small portions or adding small amounts of meat to foods like pasta or stew.  Use beans or vegetables instead of meat in common dishes like chili or lasagna.  Experiment with different cooking methods. Try roasting or broiling vegetables instead of steaming or sauteing them.  Add frozen vegetables to soups, stews, pasta, or rice.  Add nuts or seeds for added healthy fat at each meal. You can add these to yogurt, salads, or vegetable dishes.  Marinate fish or vegetables using olive oil, lemon juice, garlic, and fresh herbs. Meal planning  Plan to eat 1 vegetarian meal one day each week. Try to work up to 2 vegetarian meals, if possible.  Eat seafood 2 or more times a week.  Have healthy snacks readily available, such as: ? Vegetable sticks with hummus. ? Greek yogurt. ? Fruit and nut trail mix.  Eat balanced meals throughout the week. This includes: ? Fruit: 2-3 servings a day ? Vegetables: 4-5 servings a day ? Low-fat dairy: 2 servings a day ? Fish, poultry, or lean meat: 1 serving a day ? Beans and legumes: 2 or more servings a week ? Nuts   and seeds: 1-2 servings a day ? Whole grains: 6-8 servings a day ? Extra-virgin olive oil: 3-4 servings a day  Limit red meat and sweets to only a few servings a month What are my food choices?  Mediterranean diet ? Recommended ? Grains: Whole-grain pasta. Brown rice. Bulgar wheat. Polenta. Couscous. Whole-wheat bread. Oatmeal. Quinoa. ? Vegetables: Artichokes. Beets. Broccoli. Cabbage. Carrots. Eggplant. Green beans. Chard. Kale. Spinach. Onions. Leeks. Peas. Squash.  Tomatoes. Peppers. Radishes. ? Fruits: Apples. Apricots. Avocado. Berries. Bananas. Cherries. Dates. Figs. Grapes. Lemons. Melon. Oranges. Peaches. Plums. Pomegranate. ? Meats and other protein foods: Beans. Almonds. Sunflower seeds. Pine nuts. Peanuts. Cod. Salmon. Scallops. Shrimp. Tuna. Tilapia. Clams. Oysters. Eggs. ? Dairy: Low-fat milk. Cheese. Greek yogurt. ? Beverages: Water. Red wine. Herbal tea. ? Fats and oils: Extra virgin olive oil. Avocado oil. Grape seed oil. ? Sweets and desserts: Greek yogurt with honey. Baked apples. Poached pears. Trail mix. ? Seasoning and other foods: Basil. Cilantro. Coriander. Cumin. Mint. Parsley. Sage. Rosemary. Tarragon. Garlic. Oregano. Thyme. Pepper. Balsalmic vinegar. Tahini. Hummus. Tomato sauce. Olives. Mushrooms. ? Limit these ? Grains: Prepackaged pasta or rice dishes. Prepackaged cereal with added sugar. ? Vegetables: Deep fried potatoes (french fries). ? Fruits: Fruit canned in syrup. ? Meats and other protein foods: Beef. Pork. Lamb. Poultry with skin. Hot dogs. Bacon. ? Dairy: Ice cream. Sour cream. Whole milk. ? Beverages: Juice. Sugar-sweetened soft drinks. Beer. Liquor and spirits. ? Fats and oils: Butter. Canola oil. Vegetable oil. Beef fat (tallow). Lard. ? Sweets and desserts: Cookies. Cakes. Pies. Candy. ? Seasoning and other foods: Mayonnaise. Premade sauces and marinades. ? The items listed may not be a complete list. Talk with your dietitian about what dietary choices are right for you. Summary  The Mediterranean diet includes both food and lifestyle choices.  Eat a variety of fresh fruits and vegetables, beans, nuts, seeds, and whole grains.  Limit the amount of red meat and sweets that you eat.  Talk with your health care provider about whether it is safe for you to drink red wine in moderation. This means 1 glass a day for nonpregnant women and 2 glasses a day for men. A glass of wine equals 5 oz (150 mL). This information  is not intended to replace advice given to you by your health care provider. Make sure you discuss any questions you have with your health care provider. Document Released: 03/08/2016 Document Revised: 04/10/2016 Document Reviewed: 03/08/2016 Elsevier Interactive Patient Education  2018 [employer] American Heart Association (AHA) Exercise Recommendation  Being physically active is important to prevent heart disease and stroke, the nation's No. 1and No. 5killers. To improve overall cardiovascular health, we suggest at least 150 minutes per week of moderate exercise or 75 minutes per week of vigorous exercise (or a combination of moderate and vigorous activity). Thirty minutes a day, five times a week is an easy goal to remember. You will also experience benefits even if you divide your time into two or three segments of 10 to 15 minutes per day.  For people who would benefit from lowering their blood pressure or cholesterol, we recommend 40 minutes of aerobic exercise of moderate to vigorous intensity three to four times a week to lower the risk for heart attack and stroke.  Physical activity is anything that makes you move your body and burn calories.  This includes things like climbing stairs or playing sports. Aerobic exercises benefit your heart, and include walking, jogging, swimming or biking. Strength and   stretching exercises are best for overall stamina and flexibility.  The simplest, positive change you can make to effectively improve your heart health is to start walking. It's enjoyable, free, easy, social and great exercise. A walking program is flexible and boasts high success rates because people can stick with it. It's easy for walking to become a regular and satisfying part of life.   For Overall Cardiovascular Health:  At least 30 minutes of moderate-intensity aerobic activity at least 5 days per week for a total of 150  OR   At least 25 minutes of vigorous aerobic activity  at least 3 days per week for a total of 75 minutes; or a combination of moderate- and vigorous-intensity aerobic activity  AND   Moderate- to high-intensity muscle-strengthening activity at least 2 days per week for additional health benefits.  For Lowering Blood Pressure and Cholesterol  An average 40 minutes of moderate- to vigorous-intensity aerobic activity 3 or 4 times per week  What if I can't make it to the time goal? Something is always better than nothing! And everyone has to start somewhere. Even if you've been sedentary for years, today is the day you can begin to make healthy changes in your life. If you don't think you'll make it for 30 or 40 minutes, set a reachable goal for today. You can work up toward your overall goal by increasing your time as you get stronger. Don't let all-or-nothing thinking rob you of doing what you can every day.  Source:http://www.heart.org   Health Maintenance, Female Adopting a healthy lifestyle and getting preventive care can go a long way to promote health and wellness. Talk with your health care provider about what schedule of regular examinations is right for you. This is a good chance for you to check in with your provider about disease prevention and staying healthy. In between checkups, there are plenty of things you can do on your own. Experts have done a lot of research about which lifestyle changes and preventive measures are most likely to keep you healthy. Ask your health care provider for more information. Weight and diet Eat a healthy diet  Be sure to include plenty of vegetables, fruits, low-fat dairy products, and lean protein.  Do not eat a lot of foods high in solid fats, added sugars, or salt.  Get regular exercise. This is one of the most important things you can do for your health. ? Most adults should exercise for at least 150 minutes each week. The exercise should increase your heart rate and make you sweat  (moderate-intensity exercise). ? Most adults should also do strengthening exercises at least twice a week. This is in addition to the moderate-intensity exercise.  Maintain a healthy weight  Body mass index (BMI) is a measurement that can be used to identify possible weight problems. It estimates body fat based on height and weight. Your health care provider can help determine your BMI and help you achieve or maintain a healthy weight.  For females 20 years of age and older: ? A BMI below 18.5 is considered underweight. ? A BMI of 18.5 to 24.9 is normal. ? A BMI of 25 to 29.9 is considered overweight. ? A BMI of 30 and above is considered obese.  Watch levels of cholesterol and blood lipids  You should start having your blood tested for lipids and cholesterol at 56 years of age, then have this test every 5 years.  You may need to have your cholesterol levels   checked more often if: ? Your lipid or cholesterol levels are high. ? You are older than 56 years of age. ? You are at high risk for heart disease.  Cancer screening Lung Cancer  Lung cancer screening is recommended for adults 64-5 years old who are at high risk for lung cancer because of a history of smoking.  A yearly low-dose CT scan of the lungs is recommended for people who: ? Currently smoke. ? Have quit within the past 15 years. ? Have at least a 30-pack-year history of smoking. A pack year is smoking an average of one pack of cigarettes a day for 1 year.  Yearly screening should continue until it has been 15 years since you quit.  Yearly screening should stop if you develop a health problem that would prevent you from having lung cancer treatment.  Breast Cancer  Practice breast self-awareness. This means understanding how your breasts normally appear and feel.  It also means doing regular breast self-exams. Let your health care provider know about any changes, no matter how small.  If you are in your 20s or  30s, you should have a clinical breast exam (CBE) by a health care provider every 1-3 years as part of a regular health exam.  If you are 56 or older, have a CBE every year. Also consider having a breast X-ray (mammogram) every year.  If you have a family history of breast cancer, talk to your health care provider about genetic screening.  If you are at high risk for breast cancer, talk to your health care provider about having an MRI and a mammogram every year.  Breast cancer gene (BRCA) assessment is recommended for women who have family members with BRCA-related cancers. BRCA-related cancers include: ? Breast. ? Ovarian. ? Tubal. ? Peritoneal cancers.  Results of the assessment will determine the need for genetic counseling and BRCA1 and BRCA2 testing.  Cervical Cancer Your health care provider may recommend that you be screened regularly for cancer of the pelvic organs (ovaries, uterus, and vagina). This screening involves a pelvic examination, including checking for microscopic changes to the surface of your cervix (Pap test). You may be encouraged to have this screening done every 3 years, beginning at age 29.  For women ages 3-65, health care providers may recommend pelvic exams and Pap testing every 3 years, or they may recommend the Pap and pelvic exam, combined with testing for human papilloma virus (HPV), every 5 years. Some types of HPV increase your risk of cervical cancer. Testing for HPV may also be done on women of any age with unclear Pap test results.  Other health care providers may not recommend any screening for nonpregnant women who are considered low risk for pelvic cancer and who do not have symptoms. Ask your health care provider if a screening pelvic exam is right for you.  If you have had past treatment for cervical cancer or a condition that could lead to cancer, you need Pap tests and screening for cancer for at least 20 years after your treatment. If Pap tests  have been discontinued, your risk factors (such as having a new sexual partner) need to be reassessed to determine if screening should resume. Some women have medical problems that increase the chance of getting cervical cancer. In these cases, your health care provider may recommend more frequent screening and Pap tests.  Colorectal Cancer  This type of cancer can be detected and often prevented.  Routine colorectal cancer screening  usually begins at 56 years of age and continues through 56 years of age.  Your health care provider may recommend screening at an earlier age if you have risk factors for colon cancer.  Your health care provider may also recommend using home test kits to check for hidden blood in the stool.  A small camera at the end of a tube can be used to examine your colon directly (sigmoidoscopy or colonoscopy). This is done to check for the earliest forms of colorectal cancer.  Routine screening usually begins at age 8.  Direct examination of the colon should be repeated every 5-10 years through 56 years of age. However, you may need to be screened more often if early forms of precancerous polyps or small growths are found.  Skin Cancer  Check your skin from head to toe regularly.  Tell your health care provider about any new moles or changes in moles, especially if there is a change in a mole's shape or color.  Also tell your health care provider if you have a mole that is larger than the size of a pencil eraser.  Always use sunscreen. Apply sunscreen liberally and repeatedly throughout the day.  Protect yourself by wearing long sleeves, pants, a wide-brimmed hat, and sunglasses whenever you are outside.  Heart disease, diabetes, and high blood pressure  High blood pressure causes heart disease and increases the risk of stroke. High blood pressure is more likely to develop in: ? People who have blood pressure in the high end of the normal range (130-139/85-89 mm  Hg). ? People who are overweight or obese. ? People who are African American.  If you are 29-23 years of age, have your blood pressure checked every 3-5 years. If you are 68 years of age or older, have your blood pressure checked every year. You should have your blood pressure measured twice-once when you are at a hospital or clinic, and once when you are not at a hospital or clinic. Record the average of the two measurements. To check your blood pressure when you are not at a hospital or clinic, you can use: ? An automated blood pressure machine at a pharmacy. ? A home blood pressure monitor.  If you are between 82 years and 71 years old, ask your health care provider if you should take aspirin to prevent strokes.  Have regular diabetes screenings. This involves taking a blood sample to check your fasting blood sugar level. ? If you are at a normal weight and have a low risk for diabetes, have this test once every three years after 56 years of age. ? If you are overweight and have a high risk for diabetes, consider being tested at a younger age or more often. Preventing infection Hepatitis B  If you have a higher risk for hepatitis B, you should be screened for this virus. You are considered at high risk for hepatitis B if: ? You were born in a country where hepatitis B is common. Ask your health care provider which countries are considered high risk. ? Your parents were born in a high-risk country, and you have not been immunized against hepatitis B (hepatitis B vaccine). ? You have HIV or AIDS. ? You use needles to inject street drugs. ? You live with someone who has hepatitis B. ? You have had sex with someone who has hepatitis B. ? You get hemodialysis treatment. ? You take certain medicines for conditions, including cancer, organ transplantation, and autoimmune conditions.  Hepatitis C  Blood testing is recommended for: ? Everyone born from [birth year] through 1964. ? Anyone with known  risk factors for hepatitis C.  Sexually transmitted infections (STIs)  You should be screened for sexually transmitted infections (STIs) including gonorrhea and chlamydia if: ? You are sexually active and are younger than 56 years of age. ? You are older than 56 years of age and your health care provider tells you that you are at risk for this type of infection. ? Your sexual activity has changed since you were last screened and you are at an increased risk for chlamydia or gonorrhea. Ask your health care provider if you are at risk.  If you do not have HIV, but are at risk, it may be recommended that you take a prescription medicine daily to prevent HIV infection. This is called pre-exposure prophylaxis (PrEP). You are considered at risk if: ? You are sexually active and do not regularly use condoms or know the HIV status of your partner(s). ? You take drugs by injection. ? You are sexually active with a partner who has HIV.  Talk with your health care provider about whether you are at high risk of being infected with HIV. If you choose to begin PrEP, you should first be tested for HIV. You should then be tested every 3 months for as long as you are taking PrEP. Pregnancy  If you are premenopausal and you may become pregnant, ask your health care provider about preconception counseling.  If you may become pregnant, take 400 to 800 micrograms (mcg) of folic acid every day.  If you want to prevent pregnancy, talk to your health care provider about birth control (contraception). Osteoporosis and menopause  Osteoporosis is a disease in which the bones lose minerals and strength with aging. This can result in serious bone fractures. Your risk for osteoporosis can be identified using a bone density scan.  If you are 74 years of age or older, or if you are at risk for osteoporosis and fractures, ask your health care provider if you should be screened.  Ask your health care provider whether you  should take a calcium or vitamin D supplement to lower your risk for osteoporosis.  Menopause may have certain physical symptoms and risks.  Hormone replacement therapy may reduce some of these symptoms and risks. Talk to your health care provider about whether hormone replacement therapy is right for you. Follow these instructions at home:  Schedule regular health, dental, and eye exams.  Stay current with your immunizations.  Do not use any tobacco products including cigarettes, chewing tobacco, or electronic cigarettes.  If you are pregnant, do not drink alcohol.  If you are breastfeeding, limit how much and how often you drink alcohol.  Limit alcohol intake to no more than 1 drink per day for nonpregnant women. One drink equals 12 ounces of beer, 5 ounces of wine, or 1 ounces of hard liquor.  Do not use street drugs.  Do not share needles.  Ask your health care provider for help if you need support or information about quitting drugs.  Tell your health care provider if you often feel depressed.  Tell your health care provider if you have ever been abused or do not feel safe at home. This information is not intended to replace advice given to you by your health care provider. Make sure you discuss any questions you have with your health care provider. Document Released: 01/29/2011 Document Revised: 12/22/2015 Document Reviewed:  04/19/2015 Elsevier Interactive Patient Education  [employer].

## 2017-12-12 NOTE — Progress Notes (Signed)
Gynecology Annual Exam  PCP: Ezequiel Kayser, MD  Chief Complaint:  Chief Complaint  Patient presents with  . Gynecologic Exam    History of Present Illness:Patient is a 57 y.o. [id number] presents for annual exam. The patient has no complaints today.   LMP: No LMP recorded. Patient is postmenopausal. Menarche:not applicable  Postcoital Bleeding: no Dysmenorrhea: not applicable  The patient is sexually active. She denies dyspareunia.  The patient does occasionally perform self breast exams.  There is notable family history of breast or ovarian cancer in her family. Her sister was diagnosed with breast cancer at age 42. She was offered genetic screening and declined.  The patient wears seatbelts: yes.   The patient has regular exercise: She walks regularly.    The patient denies current symptoms of depression.     Review of Systems: Review of Systems  Constitutional: Negative.   HENT: Negative.   Eyes: Negative.   Respiratory: Negative.   Cardiovascular: Negative.   Gastrointestinal: Negative.   Genitourinary: Negative.   Musculoskeletal: Negative.   Skin: Negative.   Neurological: Negative.   Endo/Heme/Allergies: Negative.   Psychiatric/Behavioral: Negative.     Past Medical History:  Past Medical History:  Diagnosis Date  . Cancer [facility])    Multiple Myeloma  . Diabetes mellitus without complication ([place])    pre diabetic  . Hypertension     Past Surgical History:  Past Surgical History:  Procedure Laterality Date  . left salpingectomy   1996   Ectopic   . LIMBAL STEM CELL TRANSPLANT  2015    Gynecologic History:  No LMP recorded. Patient is postmenopausal. Last Pap: 2 years ago Results were:  no abnormalities  Last mammogram: 2 years ago Results were: BI-RAD I  Obstetric History: [id number]  Family History:  Family History  Problem Relation Age of Onset  . Cancer Mother   . Hypertension Mother   . Cancer Sister   . Diabetes Sister   . Crohn's  disease Sister     Social History:  Social History   Socioeconomic History  . Marital status: Married    Spouse name: Not on file  . Number of children: Not on file  . Years of education: Not on file  . Highest education level: Not on file  Occupational History  . Not on file  Social Needs  . Financial resource strain: Not on file  . Food insecurity:    Worry: Not on file    Inability: Not on file  . Transportation needs:    Medical: Not on file    Non-medical: Not on file  Tobacco Use  . Smoking status: Never Smoker  . Smokeless tobacco: Never Used  Substance and Sexual Activity  . Alcohol use: No  . Drug use: No  . Sexual activity: Yes    Birth control/protection: Post-menopausal  Lifestyle  . Physical activity:    Days per week: Not on file    Minutes per session: Not on file  . Stress: Not on file  Relationships  . Social connections:    Talks on phone: Not on file    Gets together: Not on file    Attends religious service: Not on file    Active member of club or organization: Not on file    Attends meetings of clubs or organizations: Not on file    Relationship status: Not on file  . Intimate partner violence:    Fear of current or ex partner: Not on file  Emotionally abused: Not on file    Physically abused: Not on file    Forced sexual activity: Not on file  Other Topics Concern  . Not on file  Social History Narrative  . Not on file    Allergies:  Allergies  Allergen Reactions  . Bee Venom Swelling    Medications: Prior to Admission medications   Medication Sig Start Date End Date Taking? Authorizing Provider  amLODipine (NORVASC) 5 MG tablet Take by mouth. 03/18/17  Yes [provider]  Multiple Vitamin (MULTI-VITAMINS) TABS Take by mouth.   Yes [provider]  ONE TOUCH ULTRA TEST test strip  10/01/17  Yes [provider]  Jonetta Speak LANCETS 04U [address]  10/01/17  Yes [provider]    Physical  Exam Vitals: Blood pressure 128/68, pulse 88, height '5\' 1"'  (1.549 m), weight 192 lb (87.1 kg).  General: NAD HEENT: normocephalic, anicteric Thyroid: no enlargement, no palpable nodules Pulmonary: No increased work of breathing, CTAB Cardiovascular: RRR, distal pulses 2+ Breast: Breast symmetrical, no tenderness, no palpable nodules or masses, no skin or nipple retraction present, no nipple discharge.  No axillary or supraclavicular lymphadenopathy. Abdomen: NABS, soft, non-tender, non-distended.  Umbilicus without lesions.  No hepatomegaly, splenomegaly or masses palpable. No evidence of hernia  Genitourinary: deferred for no concerns/PAP interval/shared decision making Extremities: no edema, erythema, or tenderness Neurologic: Grossly intact Psychiatric: mood appropriate, affect full   Assessment: 57 y.o. G4P0013 routine annual exam  Plan: Problem List Items Addressed This Visit    None    Visit Diagnoses    Well woman exam without gynecological exam    -  Primary   Relevant Orders   MM DIGITAL SCREENING BILATERAL   Screening for breast cancer       Relevant Orders   MM DIGITAL SCREENING BILATERAL      1) Mammogram - recommend yearly screening mammogram.  Mammogram Was ordered today  2) STI screening  wasoffered and declined  3) ASCCP guidelines and rational discussed.  Patient opts for every 3 years screening interval  4) Osteoporosis  - per USPTF routine screening DEXA at age 42   Consider FDA-approved medical therapies in postmenopausal women and men aged 32 years and older, based on the following: a) A hip or vertebral (clinical or morphometric) fracture b) T-score ? -2.5 at the femoral neck or spine after appropriate evaluation to exclude secondary causes C) Low bone mass (T-score between -1.0 and -2.5 at the femoral neck or spine) and a 10-year probability of a hip fracture ? 3% or a 10-year probability of a major osteoporosis-related fracture ? 20% based on the  [place]-adapted WHO algorithm   5) Routine healthcare maintenance including cholesterol, diabetes screening discussed managed by PCP  6) Colonoscopy last screening 4 years ago.  Screening recommended starting at age 17 for average risk individuals, age 58 for individuals deemed at increased risk (including African Americans) and recommended to continue until age 67.  For patient age 46-85 individualized approach is recommended.  Gold standard screening is via colonoscopy, Cologuard screening is an acceptable alternative for patient unwilling or unable to undergo colonoscopy.  "Colorectal cancer screening for average?risk adults: 2018 guideline update from the American Cancer Society"CA: A Cancer Journal for Clinicians: Dec 26, 2016   7) Return in 1 year (on 12/13/2018).   8) See AVS for additional recommendations    Rod Can, [facility]  12/12/2017, 3:21 PM

## 2017-12-19 ENCOUNTER — Encounter: Payer: Self-pay | Admitting: Obstetrics and Gynecology

## 2018-01-07 ENCOUNTER — Other Ambulatory Visit: Payer: Self-pay | Admitting: Advanced Practice Midwife

## 2018-01-07 DIAGNOSIS — Z1231 Encounter for screening mammogram for malignant neoplasm of breast: Secondary | ICD-10-CM

## 2018-01-29 ENCOUNTER — Ambulatory Visit
Admission: RE | Admit: 2018-01-29 | Discharge: 2018-01-29 | Disposition: A | Discharge status: Home / Self Care | Payer: Managed Care, Other (non HMO) | Source: Ambulatory Visit | Attending: Advanced Practice Midwife | Admitting: Advanced Practice Midwife

## 2018-01-29 DIAGNOSIS — Z1231 Encounter for screening mammogram for malignant neoplasm of breast: Secondary | ICD-10-CM | POA: Diagnosis present

## 2018-02-06 ENCOUNTER — Inpatient Hospital Stay
Admission: RE | Admit: 2018-02-06 | Discharge: 2018-02-06 | Disposition: A | Discharge status: Home / Self Care | Payer: Self-pay | Source: Ambulatory Visit | Attending: *Deleted | Admitting: *Deleted

## 2018-02-06 ENCOUNTER — Other Ambulatory Visit: Payer: Self-pay | Admitting: *Deleted

## 2018-02-06 DIAGNOSIS — Z9289 Personal history of other medical treatment: Secondary | ICD-10-CM

## 2019-03-17 ENCOUNTER — Other Ambulatory Visit (HOSPITAL_COMMUNITY)
Admission: RE | Admit: 2019-03-17 | Discharge: 2019-03-17 | Disposition: A | Discharge status: Home / Self Care | Payer: Managed Care, Other (non HMO) | Source: Ambulatory Visit | Attending: Maternal Newborn | Admitting: Maternal Newborn

## 2019-03-17 ENCOUNTER — Other Ambulatory Visit: Payer: Self-pay

## 2019-03-17 ENCOUNTER — Encounter: Payer: Self-pay | Admitting: Maternal Newborn

## 2019-03-17 ENCOUNTER — Ambulatory Visit (INDEPENDENT_AMBULATORY_CARE_PROVIDER_SITE_OTHER): Payer: Managed Care, Other (non HMO) | Admitting: Maternal Newborn

## 2019-03-17 VITALS — BP 130/82 | Ht 61.0 in | Wt 183.0 lb

## 2019-03-17 DIAGNOSIS — Z1231 Encounter for screening mammogram for malignant neoplasm of breast: Secondary | ICD-10-CM

## 2019-03-17 DIAGNOSIS — Z124 Encounter for screening for malignant neoplasm of cervix: Secondary | ICD-10-CM | POA: Insufficient documentation

## 2019-03-17 DIAGNOSIS — Z01419 Encounter for gynecological examination (general) (routine) without abnormal findings: Secondary | ICD-10-CM

## 2019-03-17 NOTE — Patient Instructions (Signed)

## 2019-03-17 NOTE — Progress Notes (Signed)
Gynecology Annual Exam  PCP: Ezequiel Kayser, MD  Chief Complaint:  Chief Complaint  Patient presents with  . Gynecologic Exam    History of Present Illness:Patient is a 58 y.o. [id number] presenting for an annual exam. The patient has no complaints today.   LMP: No LMP recorded. Patient is postmenopausal. No bleeding or symptoms.  The patient is sexually active. She does not have dyspareunia.  The patient does perform self breast exams.  There is notable family history of breast cancer in her family (mother in her 78s and sister at 65). She declines MyRisk screening today.  The patient wears seatbelts: yes.   The patient has regular exercise: yes.    The patient does not current symptoms of depression.    Domestic violence screening is negative.  Review of Systems  Constitutional: Negative.   HENT: Negative.   Eyes: Negative.   Respiratory: Negative for shortness of breath and wheezing.   Cardiovascular: Negative for chest pain and palpitations.  Gastrointestinal: Negative.   Genitourinary: Negative.   Musculoskeletal: Negative.   Skin: Negative.   Neurological: Negative.   Endo/Heme/Allergies: Negative.   Psychiatric/Behavioral: Negative.   All other systems reviewed and are negative.   Past Medical History:  Past Medical History:  Diagnosis Date  . Cancer [facility])    Multiple Myeloma  . Diabetes mellitus without complication ([place])    pre diabetic  . Family history of breast cancer   . Hypertension     Past Surgical History:  Past Surgical History:  Procedure Laterality Date  . left salpingectomy   1996   Ectopic   . LIMBAL STEM CELL TRANSPLANT  2015    Gynecologic History:  No LMP recorded. Patient is postmenopausal. Last Pap: 2017: Results were: Normal per patient Last mammogram: 01/29/2018. Results were: BI-RADS I Obstetric History: [id number]  Family History:  Family History  Problem Relation Age of Onset  . Hypertension Mother   . Breast cancer Mother  72  . Breast cancer Sister 63  . Diabetes Sister   . Crohn's disease Sister   . Breast cancer Maternal Aunt 60    Social History:  Social History   Socioeconomic History  . Marital status: Married    Spouse name: Not on file  . Number of children: Not on file  . Years of education: Not on file  . Highest education level: Not on file  Occupational History  . Not on file  Social Needs  . Financial resource strain: Not on file  . Food insecurity    Worry: Not on file    Inability: Not on file  . Transportation needs    Medical: Not on file    Non-medical: Not on file  Tobacco Use  . Smoking status: Never Smoker  . Smokeless tobacco: Never Used  Substance and Sexual Activity  . Alcohol use: No  . Drug use: No  . Sexual activity: Yes    Birth control/protection: Post-menopausal  Lifestyle  . Physical activity    Days per week: Not on file    Minutes per session: Not on file  . Stress: Not on file  Relationships  . Social Herbalist on phone: Not on file    Gets together: Not on file    Attends religious service: Not on file    Active member of club or organization: Not on file    Attends meetings of clubs or organizations: Not on file    Relationship status: Not  on file  . Intimate partner violence    Fear of current or ex partner: Not on file    Emotionally abused: Not on file    Physically abused: Not on file    Forced sexual activity: Not on file  Other Topics Concern  . Not on file  Social History Narrative  . Not on file    Allergies:  Allergies  Allergen Reactions  . Bee Venom Swelling    Medications: Prior to Admission medications   Medication Sig Start Date End Date Taking? Authorizing Provider  amLODipine (NORVASC) 5 MG tablet Take by mouth. 03/18/17  Yes [provider]  Multiple Vitamin (MULTI-VITAMINS) TABS Take by mouth.   Yes [provider]  ONE TOUCH ULTRA TEST test strip  10/01/17  Yes [provider]   Jonetta Speak LANCETS [address] [place]  10/01/17  Yes [provider]    Physical Exam Vitals: Blood pressure 130/82, height '5\' 1"'  (1.549 m), weight 183 lb (83 kg).  General: NAD HEENT: normocephalic, anicteric Thyroid: no enlargement Pulmonary: No increased work of breathing, CTAB Cardiovascular: RRR, no murmurs, rubs or gallops Breast: Breasts symmetrical, no tenderness, no palpable nodules or masses, no skin or nipple retraction present, no nipple discharge.  No axillary or supraclavicular lymphadenopathy. Abdomen: Soft, non-tender, non-distended.  Umbilicus without lesions.  No hepatomegaly, splenomegaly or masses palpable. No evidence of hernia  Genitourinary:  External: Normal external female genitalia.  Normal urethral  meatus, normal Bartholin's and Skene's glands.    Vagina: Normal vaginal mucosa, no evidence of prolapse.    Cervix: Grossly normal in appearance, no bleeding  Uterus: Non-enlarged, mobile, normal contour.  No CMT  Adnexa: ovaries non-enlarged, no adnexal masses  Rectal: deferred  Lymphatic: no evidence of inguinal lymphadenopathy Extremities: no edema, erythema, or tenderness Neurologic: Grossly intact Psychiatric: mood appropriate, affect full   Assessment: 58 y.o. [id number] here for an annual exam  Plan: Problem List Items Addressed This Visit    None    Visit Diagnoses    Women's annual routine gynecological examination    -  Primary   Pap smear for cervical cancer screening       Relevant Orders   Cytology - PAP   Breast cancer screening by mammogram       Relevant Orders   MM DIGITAL SCREENING BILATERAL      1) Mammogram - recommend yearly screening mammogram.  Mammogram was ordered today; patient prefers to have screening at [facility]. Asked her to have them call for a faxed order if they cannot see the order in Epic.  2) STI screening was offered and declined  3) ASCCP guidelines and rationale discussed.  Patient opts for every 3 year screening  interval  4) Osteoporosis  - per USPTF routine screening DEXA at age 1  5) Routine healthcare maintenance including cholesterol, diabetes screening: managed by PCP  6) Colonoscopy done in 2015, next due in 2025.  7) Follow up 1 year for an annual exam.  Avel Sensor, CNM 03/17/2019  2:22 PM

## 2019-03-19 ENCOUNTER — Telehealth: Payer: Self-pay

## 2019-03-19 LAB — CYTOLOGY - PAP
Diagnosis: NEGATIVE
HPV: NOT DETECTED

## 2019-03-19 NOTE — Telephone Encounter (Signed)
Pt calling triage requesting an order for her mammo be sent to [facility]. Please advise

## 2019-03-20 NOTE — Telephone Encounter (Signed)
I will bring you a printed copy to fax, but please call her and ask which location needs the order to ensure it goes to the right place.

## 2019-03-20 NOTE — Telephone Encounter (Signed)
Lm with pt to call back with fax number. Please get when she calls back

## 2019-03-20 NOTE — Telephone Encounter (Signed)
Please let me know when pt calls back. I need to know fax number of where she wants it sent. I will be out of the office all week next week, so please send to JS CMA to get this faxed.

## 2021-02-22 ENCOUNTER — Ambulatory Visit (INDEPENDENT_AMBULATORY_CARE_PROVIDER_SITE_OTHER): Payer: Managed Care, Other (non HMO) | Admitting: Advanced Practice Midwife

## 2021-02-22 ENCOUNTER — Encounter: Payer: Self-pay | Admitting: Advanced Practice Midwife

## 2021-02-22 ENCOUNTER — Other Ambulatory Visit: Payer: Self-pay

## 2021-02-22 VITALS — BP 131/81 | Ht 61.0 in | Wt 176.0 lb

## 2021-02-22 DIAGNOSIS — Z1239 Encounter for other screening for malignant neoplasm of breast: Secondary | ICD-10-CM

## 2021-02-22 DIAGNOSIS — Z0000 Encounter for general adult medical examination without abnormal findings: Secondary | ICD-10-CM | POA: Diagnosis not present

## 2021-02-23 ENCOUNTER — Encounter: Payer: Self-pay | Admitting: Advanced Practice Midwife

## 2021-02-23 NOTE — Progress Notes (Signed)
Gynecology Annual Exam  PCP: Ezequiel Kayser, MD (Inactive)  Chief Complaint:  Chief Complaint  Patient presents with   Gynecologic Exam    Annual - MMG need to be done @ [facility], no concerns. RM 3    History of Present Illness:Patient is a 60 y.o. [id number] presents for annual exam. The patient has no complaints today.   LMP: No LMP recorded. Patient is postmenopausal.   The patient is sexually active. She denies dyspareunia.  The patient does perform self breast exams.  There is notable family history of breast or ovarian cancer in her family. Her sister was diagnosed with breast cancer at age 72. She declined MyRisk testing.  The patient wears seatbelts: yes.   The patient has regular exercise: "not much".  She tries to eat healthy and tries to stay hydrated. She usually has 6-7 hours sleep.  The patient denies current symptoms of depression.     Review of Systems: Review of Systems  Constitutional:  Negative for chills and fever.  HENT:  Negative for congestion, ear discharge, ear pain, hearing loss, sinus pain and sore throat.   Eyes:  Negative for blurred vision and double vision.  Respiratory:  Negative for cough, shortness of breath and wheezing.   Cardiovascular:  Negative for chest pain, palpitations and leg swelling.  Gastrointestinal:  Negative for abdominal pain, blood in stool, constipation, diarrhea, heartburn, melena, nausea and vomiting.  Genitourinary:  Negative for dysuria, flank pain, frequency, hematuria and urgency.  Musculoskeletal:  Negative for back pain, joint pain and myalgias.  Skin:  Negative for itching and rash.  Neurological:  Negative for dizziness, tingling, tremors, sensory change, speech change, focal weakness, seizures, loss of consciousness, weakness and headaches.  Endo/Heme/Allergies:  Negative for environmental allergies. Does not bruise/bleed easily.  Psychiatric/Behavioral:  Negative for depression, hallucinations, memory loss, substance  abuse and suicidal ideas. The patient is not nervous/anxious and does not have insomnia.    Past Medical History:  Patient Active Problem List   Diagnosis Date Noted   Type 2 diabetes mellitus without complication, without long-term current use of insulin ([place]) 12/12/2017    HgbA1c reached 6.8% on 01/01/2017.     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  chemo   b. steroids      Past Surgical History:  Past Surgical History:  Procedure Laterality Date   left salpingectomy   1996   Ectopic    LIMBAL STEM CELL TRANSPLANT  2015    Gynecologic History:  No LMP recorded. Patient is postmenopausal. Last Pap: 2020 Results were:  no abnormalities  Last mammogram: 2021 Results were: BI-RAD I  Obstetric History: [id number]  Family History:  Family History  Problem Relation Age of Onset   Hypertension Mother    Breast cancer Mother 76   Breast cancer Sister 46   Diabetes Sister    Crohn's disease Sister    Breast cancer Maternal Aunt 25  Social History:  Social History   Socioeconomic History   Marital status: Married    Spouse name: Not on file   Number of children: Not on file   Years of education: Not on file   Highest education level: Not on file  Occupational History   Not on file  Tobacco Use   Smoking status: Never   Smokeless tobacco: Never  Vaping Use   Vaping Use: Never used  Substance and Sexual Activity   Alcohol use:  No   Drug use: No   Sexual activity: Yes    Birth control/protection: Post-menopausal  Other Topics Concern   Not on file  Social History Narrative   Not on file   Social Determinants of Health   Financial Resource Strain: Not on file  Food Insecurity: Not on file  Transportation Needs: Not on file  Physical Activity: Not on file  Stress: Not on file  Social Connections: Not on file  Intimate Partner Violence: Not on file    Allergies:  Allergies  Allergen Reactions   Bee Venom Swelling    Medications: Prior to Admission medications   Medication Sig Start Date End Date Taking? Authorizing Provider  amLODipine (NORVASC) 5 MG tablet Take by mouth. 03/18/17  Yes [provider]  metFORMIN (GLUCOPHAGE-XR) 500 MG 24 hr tablet Take 500 mg by mouth 2 (two) times daily. 01/04/21  Yes [provider]  Multiple Vitamin (MULTI-VITAMINS) TABS Take by mouth.   Yes [provider]  ONE TOUCH ULTRA TEST test strip  10/01/17   [provider]  Jonetta Speak LANCETS [address] [place]  10/01/17   [provider]    Physical Exam Vitals: Blood pressure 131/81, height '5\' 1"'  (1.549 m), weight 176 lb (79.8 kg).  General: NAD HEENT: normocephalic, anicteric Thyroid: no enlargement, no palpable nodules Pulmonary: No increased work of breathing, CTAB Cardiovascular: RRR, distal pulses 2+ Breast: Breast symmetrical, no tenderness, no palpable nodules or masses, no skin or nipple retraction present, no nipple discharge.  No axillary or supraclavicular lymphadenopathy. Abdomen: NABS, soft, non-tender, non-distended.  Umbilicus without lesions.  No hepatomegaly, splenomegaly or masses palpable. No evidence of hernia  Genitourinary: deferred for no concerns/PAP interval Extremities: no edema, erythema, or tenderness Neurologic: Grossly intact Psychiatric: mood appropriate, affect full     Assessment: 60 y.o. G4P0013 routine annual exam  Plan: Problem List  Items Addressed This Visit   None Visit Diagnoses     Well woman exam without gynecological exam    -  Primary   Relevant Orders   MM DIGITAL SCREENING BILATERAL   Breast screening       Relevant Orders   MM DIGITAL SCREENING BILATERAL       1) Mammogram - recommend yearly screening mammogram.  Mammogram Was ordered today  2) STI screening  was offered and declined  3) ASCCP guidelines and rationale discussed.  Patient opts for every 3 years screening interval  4) Osteoporosis  - per USPTF routine screening DEXA at age 77  Consider FDA-approved medical therapies in postmenopausal women and men aged 9 years and older, based on the following: a) A hip or vertebral (clinical or morphometric) fracture b) T-score ? -2.5 at the femoral neck or spine after appropriate evaluation to exclude secondary causes C) Low bone mass (T-score between -1.0 and -2.5 at the femoral neck or spine) and a 10-year probability of a hip fracture ? 3% or a 10-year probability of a major osteoporosis-related fracture ? 20% based on  the [place]-adapted WHO algorithm   5) Routine healthcare maintenance including cholesterol, diabetes screening discussed managed by PCP  6) Colonoscopy up to date.  Screening recommended starting at age 47 for average risk individuals, age 58 for individuals deemed at increased risk (including African Americans) and recommended to continue until age 24.  For patient age 30-85 individualized approach is recommended.  Gold standard screening is via colonoscopy, Cologuard screening is an acceptable alternative for patient unwilling or unable to undergo colonoscopy.  "Colorectal cancer screening for average?risk adults: 2018 guideline update from the American Cancer Society"CA: A Cancer Journal for Clinicians: Dec 26, 2016   7) Return in about 1 year (around 02/22/2022) for annual established gyn.    Christean Leaf, CNM [facility] 02/23/21, 1:16  PM

## 2022-04-18 ENCOUNTER — Other Ambulatory Visit: Payer: Self-pay | Admitting: Gerontology

## 2022-04-18 DIAGNOSIS — Z1231 Encounter for screening mammogram for malignant neoplasm of breast: Secondary | ICD-10-CM

## 2022-04-19 ENCOUNTER — Encounter: Payer: Self-pay | Admitting: Obstetrics and Gynecology

## 2024-04-30 ENCOUNTER — Other Ambulatory Visit: Payer: Self-pay | Admitting: Gerontology

## 2024-04-30 DIAGNOSIS — E890 Postprocedural hypothyroidism: Secondary | ICD-10-CM

## 2024-05-04 ENCOUNTER — Ambulatory Visit
Admission: RE | Admit: 2024-05-04 | Discharge: 2024-05-04 | Disposition: A | Discharge status: Home / Self Care | Source: Ambulatory Visit | Attending: Gerontology | Admitting: Gerontology

## 2024-05-04 DIAGNOSIS — E890 Postprocedural hypothyroidism: Secondary | ICD-10-CM | POA: Insufficient documentation

## 2024-05-22 ENCOUNTER — Other Ambulatory Visit: Payer: Self-pay | Admitting: Gerontology

## 2024-05-22 DIAGNOSIS — E041 Nontoxic single thyroid nodule: Secondary | ICD-10-CM

## 2024-05-22 DIAGNOSIS — E890 Postprocedural hypothyroidism: Secondary | ICD-10-CM

## 2024-05-26 NOTE — Progress Notes (Signed)
 Patient for US  guided FNA RT [place] & RT Mid Thyroid Nodule Biopsies on Wed 05/27/24, I called and spoke with the patient on the phone and gave pre-procedure instructions. Pt was made aware to be here at 2p and check in at the [facility] registration desk. Pt stated understanding.  Called 05/22/24

## 2024-05-27 ENCOUNTER — Ambulatory Visit
Admission: RE | Admit: 2024-05-27 | Discharge: 2024-05-27 | Disposition: A | Discharge status: Home / Self Care | Source: Ambulatory Visit | Attending: Gerontology | Admitting: Gerontology

## 2024-05-27 ENCOUNTER — Other Ambulatory Visit: Payer: Self-pay | Admitting: Gerontology

## 2024-05-27 VITALS — BP 121/82 | HR 80

## 2024-05-27 DIAGNOSIS — E041 Nontoxic single thyroid nodule: Secondary | ICD-10-CM

## 2024-05-27 DIAGNOSIS — E042 Nontoxic multinodular goiter: Secondary | ICD-10-CM | POA: Insufficient documentation

## 2024-05-27 DIAGNOSIS — E890 Postprocedural hypothyroidism: Secondary | ICD-10-CM

## 2024-05-27 MED ORDER — LIDOCAINE HCL (PF) 1 % IJ SOLN
5.0000 mL | Freq: Once | INTRAMUSCULAR | Status: AC
  Administered 2024-05-27: 5 mL
  Filled 2024-05-27: qty 5

## 2024-05-31 LAB — CYTOLOGY - NON PAP

## 2024-06-17 ENCOUNTER — Encounter: Payer: Self-pay | Admitting: Radiology

## 2024-07-06 NOTE — Progress Notes (Signed)
 PATIENT PROFILE: Annette Jimenez is a 63 y.o. female who presents to the [facility] for a consultation at the request of Dr. Steva for evaluation of colonoscopy. PMhx of Multiple Myeloma 2014.   HISTORY OF PRESENT ILLNESS: Annette Jimenez reports overall doing well from GI standpoint.  She generally has a bowel movement every 2 to 3 days.  She feels this is her baseline and denies significant straining.  No lower abdominal pain, diarrhea, rectal bleeding.  She denies any GERD, nausea, vomiting, epigastric pain, dysphagia typically.  Since she had stem cell transplant she does have occasional nausea and diarrhea with deli sandwiches but she avoids and this seems to be main trigger.  She denies tobacco-alcohol NSAIDs.  Denies any family hx colon polyps or colon cancer.     GENERAL REVIEW OF SYSTEMS:  Constitutional: No weight loss/weight gain   Eyes: No changes in vision. ENT: No oral lesions, sore throat.   GI: see HPI.   Heme/Lymph: No easy bruising.   CV: No chest pain. Resp: No cough, SOB.     GU: No hematuria.   Integumentary: No rashes.   Neuro: No headaches.   Psych: No depression/anxiety.   Endocrine: No heat/cold intolerance.   Musculoskeletal: No joint swelling.   MEDICATIONS: Outpatient Encounter Medications as of 07/06/2024  Medication Sig Dispense Refill   alcohol swabs PadM Apply 1 each topically once daily When checking blood sugars 100 each 3   amLODIPine (NORVASC) 10 MG tablet TAKE 1 TABLET BY MOUTH ONCE  DAILY FOR BLOOD PRESSURE 90 tablet 3   cetirizine (ZYRTEC) 10 MG tablet Take 10 mg by mouth once daily as needed for Allergies     cholecalciferol (VITAMIN D3) 2,000 unit tablet Take 2,000 Units by mouth once daily     CONTOUR NEXT TEST STRIPS test strip USE 1 STRIP ONCE DAILY AS  DIRECTED 100 strip 3   cyanocobalamin (VITAMIN B12) 500 MCG tablet for Vitamin B12 Deficiency. 360 tablet 11   lancets (ONETOUCH DELICA LANCETS) 33 gauge Misc Use 1  each once daily. As directed 100 each 3   metFORMIN (GLUCOPHAGE-XR) 500 MG XR tablet TAKE 3 TABLETS BY MOUTH ONCE  DAILY 270 tablet 3   blood glucose meter kit by XX route once daily 1 each 0   omega 3-dha-epa-fish oil (FISH OIL) 1,000 mg (120 mg-180 mg) Cap Take 1 capsule by mouth once daily (Patient not taking: Reported on 07/06/2024) 365 capsule 11   sodium, potassium, and magnesium (SUPREP) oral solution Take 1 Bottle by mouth as directed One kit contains 2 bottles.  Take both bottles at the times instructed by your provider. 354 mL 0   No facility-administered encounter medications on file as of 07/06/2024.    ALLERGIES: Venom-honey bee  PAST MEDICAL HISTORY: Past Medical History:  Diagnosis Date   Allergic rhinitis 04/19/2014   Hyperlipidemia associated with type 2 diabetes mellitus (CMS/HHS-HCC) 04/26/2016   Hyperopia, bilateral 03/20/2018   Seeing Dr. Mevelyn   Hypertension associated with type 2 diabetes mellitus (CMS/HHS-HCC) 12/09/2013   Multiple myeloma (CMS/HHS-HCC)    S/P chemotherapy, then autologous stem cell transplant at [facility] hypothyroidism 04/18/2014   S/P RAI for goiter ca 2010    Type 2 diabetes mellitus with other specified complication, without long-term current use of insulin (CMS/HHS-HCC)    HgbA1c reached 6.8% on 01/01/2017.    PAST SURGICAL HISTORY: Past Surgical History:  Procedure Laterality Date   REMOVAL ECTOPIC PREGNANCY W/SALPINGO-OPHORECTOMY ABDOMINAL Left ca  1996     FAMILY HISTORY: Family History  Problem Relation Name Age of Onset   Diabetes Father     High blood pressure (Hypertension) Father     High blood pressure (Hypertension) Mother     Arthritis Mother     Diabetes Sister     Crohn's disease Sister       SOCIAL HISTORY: Social History   Socioeconomic History   Marital status: Married  Tobacco Use   Smoking status: Never    Passive exposure: Never   Smokeless tobacco: Never  Substance and Sexual  Activity   Alcohol use: No    Alcohol/week: 0.0 standard drinks of alcohol   Drug use: No   Sexual activity: Yes    Partners: Male    Birth control/protection: Post-menopausal  Social History Narrative   Married quarry manager at [employer]   Social Drivers of Health   Financial Resource Strain: Low Risk  (04/29/2024)   Overall Financial Resource Strain (CARDIA)    Difficulty of Paying Living Expenses: Not hard at all  Food Insecurity: No Food Insecurity (04/29/2024)   Hunger Vital Sign    Worried About Running Out of Food in the Last Year: Never true    Ran Out of Food in the Last Year: Never true  Transportation Needs: No Transportation Needs (04/29/2024)   PRAPARE - Administrator, Civil Service (Medical): No    Lack of Transportation (Non-Medical): No    Vitals:   07/06/24 1019  BP: 130/80  Pulse: 103  Weight: 76.5 kg (168 lb 9.6 oz)  Height: 154.9 cm (5' 1)    Wt Readings from Last 3 Encounters:  07/06/24 76.5 kg (168 lb 9.6 oz)  04/29/24 77.6 kg (171 lb)  10/28/23 78.5 kg (173 lb)     PHYSICAL EXAM: General: NAD, alert and oriented x 4 HEENT: PEERLA, EOMI, anciteric  Neck: supple, no JVD or thyromegaly. No lymphadenopathy.  Respiratory: CTA bilaterally, no wheezes, crackles, or other adventitious sounds Cardiac: RRR, no murmur, rub, or gallop  Abd: soft, normal bowel sounds, no TTP, no HSM, no rebound or guarding Ext: no edema, well perfused with 2+ pulses, Skin: Skin color, texture, turgor normal, no rashes or lesions Lymph: no LAD Neuro: Grossly intact   REVIEW OF DATA: I have reviewed the following data today:   05/2024-CMP normal. CBC normal.    Colonoscopy 2015  ASSESSMENT AND PLAN: Annette Jimenez is a 63 y.o. female presenting for an initial consultation.   Diagnoses and all orders for this visit:  Colon cancer screening -     Ambulatory Referral to Colonoscopy  Chronic constipation  Other orders -     sodium, potassium,  and magnesium (SUPREP) oral solution; Take 1 Bottle by mouth as directed One kit contains 2 bottles.  Take both bottles at the times instructed by your provider.    1. Colon cancer screening-last colonoscopy 2015. Due for 10 year repeat colonoscopy.  No lower GI alarm symptoms.  Denies any prior issues with sedation.  Schedule routine colonoscopy.  2.  Mild chronic constipation-we discussed using MiraLAX daily the week of colonoscopy to ensure bowels are moving well prior to colonoscopy to decrease risk for prep.  No upper GI symptoms.  I reviewed the risks (including bleeding, perforation, infection, anesthesia complications, cardiac/respiratory complications), benefits and alternatives of Colonoscopy. Patient consents to proceed. Denies CP/SOB/blood thinners.    Attestation Statement:   I personally performed the service, non-incident to. (WP)   JANICE BRIDGES  WOODARD, PA      Romero FURY Mevelyn RIGGERS [facility]

## 2024-07-15 ENCOUNTER — Encounter: Payer: Self-pay | Admitting: Gastroenterology

## 2024-07-16 ENCOUNTER — Encounter: Payer: Self-pay | Admitting: Gastroenterology

## 2024-07-16 ENCOUNTER — Ambulatory Visit: Admitting: Registered Nurse

## 2024-07-16 ENCOUNTER — Encounter: Admission: RE | Disposition: A | Payer: Self-pay | Attending: Gastroenterology

## 2024-07-16 ENCOUNTER — Ambulatory Visit
Admission: RE | Admit: 2024-07-16 | Discharge: 2024-07-16 | Disposition: A | Discharge status: Home / Self Care | Attending: Gastroenterology | Admitting: Gastroenterology

## 2024-07-16 ENCOUNTER — Other Ambulatory Visit: Payer: Self-pay

## 2024-07-16 DIAGNOSIS — Z1211 Encounter for screening for malignant neoplasm of colon: Secondary | ICD-10-CM | POA: Diagnosis present

## 2024-07-16 DIAGNOSIS — E119 Type 2 diabetes mellitus without complications: Secondary | ICD-10-CM | POA: Insufficient documentation

## 2024-07-16 DIAGNOSIS — I10 Essential (primary) hypertension: Secondary | ICD-10-CM | POA: Insufficient documentation

## 2024-07-16 HISTORY — PX: COLONOSCOPY: SHX5424

## 2024-07-16 LAB — GLUCOSE, CAPILLARY: Glucose-Capillary: 99 mg/dL (ref 70–99)

## 2024-07-16 SURGERY — COLONOSCOPY
Anesthesia: General

## 2024-07-16 MED ORDER — PROPOFOL 10 MG/ML IV BOLUS
INTRAVENOUS | Status: DC | PRN
  Administered 2024-07-16: 11:00:00 100 ug/kg/min via INTRAVENOUS
  Administered 2024-07-16 (×2): 50 mg via INTRAVENOUS

## 2024-07-16 MED ORDER — LIDOCAINE HCL (CARDIAC) PF 100 MG/5ML IV SOSY
PREFILLED_SYRINGE | INTRAVENOUS | Status: DC | PRN
  Administered 2024-07-16: 11:00:00 100 mg via INTRAVENOUS

## 2024-07-16 MED ORDER — SODIUM CHLORIDE 0.9 % IV SOLN: INTRAVENOUS | Status: DC

## 2024-07-16 MED ORDER — LIDOCAINE HCL (PF) 2 % IJ SOLN
INTRAMUSCULAR | Status: AC
  Filled 2024-07-16: qty 5

## 2024-07-16 NOTE — Interval H&P Note (Signed)
 History and Physical Interval Note: Preprocedure H&P from 07/16/2024  was reviewed and there was no interval change after seeing and examining the patient.  Written consent was obtained from the patient after discussion of risks, benefits, and alternatives. Patient has consented to proceed with Colonoscopy with possible intervention   07/16/2024 10:40 AM  Annette Jimenez  has presented today for surgery, with the diagnosis of Colon Cancer Screening.  The various methods of treatment have been discussed with the patient and family. After consideration of risks, benefits and other options for treatment, the patient has consented to  Procedures: COLONOSCOPY (N/A) as a surgical intervention.  The patient's history has been reviewed, patient examined, no change in status, stable for surgery.  I have reviewed the patient's chart and labs.  Questions were answered to the patient's satisfaction.     [name]

## 2024-07-16 NOTE — Op Note (Signed)
 [facility] Patient Name: Annette Jimenez Procedure Date: 07/16/2024 10:33 AM MRN: [id number] Account #: [id number] Date of Birth: Aug 06, 1960 Admit Type: Outpatient Age: 63 Room: [facility] ENDO ROOM 1 Gender: Female Note Status: Finalized Instrument Name: Colon Scope [id number] Procedure:             Colonoscopy Indications:           Screening for colorectal malignant neoplasm Providers:             Elspeth Ozell Onita ROSALEA, DO Referring MD:          Steva Kirsch Medicines:             Monitored Anesthesia Care Complications:         No immediate complications. Estimated blood loss: None. Procedure:             Pre-Anesthesia Assessment:                        - Prior to the procedure, a History and Physical was                         performed, and patient medications and allergies were                         reviewed. The patient is competent. The risks and                         benefits of the procedure and the sedation options and                         risks were discussed with the patient. All questions                         were answered and informed consent was obtained.                         Patient identification and proposed procedure were                         verified by the physician, the nurse, the anesthetist                         and the technician in the endoscopy suite. Mental                         Status Examination: alert and oriented. Airway                         Examination: normal oropharyngeal airway and neck                         mobility. Respiratory Examination: clear to                         auscultation. CV Examination: RRR, no murmurs, no S3                         or S4. Prophylactic Antibiotics: The patient does not  require prophylactic antibiotics. Prior                         Anticoagulants: The patient has taken no anticoagulant                         or antiplatelet  agents. ASA Grade Assessment: III - A                         patient with severe systemic disease. After reviewing                         the risks and benefits, the patient was deemed in                         satisfactory condition to undergo the procedure. The                         anesthesia plan was to use monitored anesthesia care                         (MAC). Immediately prior to administration of                         medications, the patient was re-assessed for adequacy                         to receive sedatives. The heart rate, respiratory                         rate, oxygen saturations, blood pressure, adequacy of                         pulmonary ventilation, and response to care were                         monitored throughout the procedure. The physical                         status of the patient was re-assessed after the                         procedure.                        After obtaining informed consent, the colonoscope was                         passed under direct vision. Throughout the procedure,                         the patient's blood pressure, pulse, and oxygen                         saturations were monitored continuously. The                         Colonoscope was introduced through the anus and  advanced to the the terminal ileum, with                         identification of the appendiceal orifice and IC                         valve. The colonoscopy was performed without                         difficulty. The patient tolerated the procedure well.                         The quality of the bowel preparation was evaluated                         using the BBPS [facility] Bowel Preparation Scale) with                         scores of: Right Colon = 3, Transverse Colon = 3 and                         Left Colon = 3 (entire mucosa seen well with no                         residual staining, small fragments of stool or  opaque                         liquid). The total BBPS score equals 9. The terminal                         ileum, ileocecal valve, appendiceal orifice, and                         rectum were photographed. Findings:      The perianal and digital rectal examinations were normal. Pertinent       negatives include normal sphincter tone.      The terminal ileum appeared normal. Estimated blood loss: none.      Retroflexion in the right colon was performed.      The entire examined colon appeared normal on direct and retroflexion       views. Impression:            - The examined portion of the ileum was normal.                        - The entire examined colon is normal on direct and                         retroflexion views.                        - No specimens collected. Recommendation:        - Patient has a contact number available for                         emergencies. The signs and symptoms of potential  delayed complications were discussed with the patient.                         Return to normal activities tomorrow. Written                         discharge instructions were provided to the patient.                        - Discharge patient to home.                        - Resume previous diet.                        - Continue present medications.                        - Repeat colonoscopy in 10 years for screening                         purposes.                        - Return to referring physician as previously                         scheduled.                        - The findings and recommendations were discussed with                         the patient. Procedure Code(s):     --- Professional ---                        [phone], Colonoscopy, flexible; diagnostic, including                         collection of specimen(s) by brushing or washing, when                         performed (separate procedure) Diagnosis Code(s):     ---  Professional ---                        Z12.11, Encounter for screening for malignant neoplasm                         of colon CPT copyright 2022 American Medical Association. All rights reserved. The codes documented in this report are preliminary and upon coder review may  be revised to meet current compliance requirements. Attending Participation:      I personally performed the entire procedure. Elspeth Jungling, DO [name] DO, DO 07/16/2024 11:11:37 AM This report has been signed electronically. Number of Addenda: 0 Note Initiated On: 07/16/2024 10:33 AM Scope Withdrawal Time: 0 hours 11 minutes 55 seconds  Total Procedure Duration: 0 hours 19 minutes 34 seconds  Estimated Blood Loss:  Estimated blood loss: none.      [facility]

## 2024-07-16 NOTE — Anesthesia Procedure Notes (Signed)
 Procedure Name: MAC Date/Time: 07/16/2024 10:43 AM  Performed by: Lorrene Camelia LABOR, CRNAPre-anesthesia Checklist: Patient identified, Emergency Drugs available, Suction available and Patient being monitored Patient Re-evaluated:Patient Re-evaluated prior to induction Oxygen Delivery Method: Simple face mask Preoxygenation: Pre-oxygenation with 100% oxygen Induction Type: IV induction Comments: POM

## 2024-07-16 NOTE — Transfer of Care (Signed)
 Immediate Anesthesia Transfer of Care Note  Patient: Annette Jimenez  Procedure(s) Performed: COLONOSCOPY  Patient Location: Endoscopy Unit  Anesthesia Type:General  Level of Consciousness: awake and patient cooperative  Airway & Oxygen Therapy: Patient Spontanous Breathing  Post-op Assessment: Report given to RN and Post -op Vital signs reviewed and stable  Post vital signs: Reviewed and stable  Last Vitals:  Vitals Value Taken Time  BP 96/65 07/16/24 11:16  Temp 36 C 07/16/24 11:16  Pulse 85 07/16/24 11:16  Resp 17 07/16/24 11:16  SpO2 99 % 07/16/24 11:16  Vitals shown include unfiled device data.  Last Pain:  Vitals:   07/16/24 1116  TempSrc: Tympanic         Complications: No notable events documented.

## 2024-07-16 NOTE — Anesthesia Preprocedure Evaluation (Signed)
 Anesthesia Evaluation  Patient identified by MRN, date of birth, ID band Patient awake    Reviewed: Allergy & Precautions, H&P , NPO status , Patient's Chart, lab work & pertinent test results, reviewed documented beta blocker date and time   Airway Mallampati: II   Neck ROM: full    Dental  (+) Poor Dentition   Pulmonary neg pulmonary ROS   Pulmonary exam normal        Cardiovascular hypertension, On Medications negative cardio ROS Normal cardiovascular exam Rhythm:regular Rate:Normal     Neuro/Psych negative neurological ROS  negative psych ROS   GI/Hepatic negative GI ROS, Neg liver ROS,,,  Endo/Other  diabetes, Well ControlledHypothyroidism    Renal/GU negative Renal ROS  negative genitourinary   Musculoskeletal   Abdominal   Peds  Hematology  (+) Blood dyscrasia, anemia   Anesthesia Other Findings Past Medical History: No date: Cancer (HCC)     Comment:  Multiple Myeloma No date: Diabetes mellitus without complication (HCC)     Comment:  pre diabetic No date: Family history of breast cancer No date: Hypertension No date: Multiple myeloma (HCC) Past Surgical History: 1996: left salpingectomy      Comment:  Ectopic  2015: LIMBAL STEM CELL TRANSPLANT BMI    Body Mass Index: 30.67 kg/m     Reproductive/Obstetrics negative OB ROS                              Anesthesia Physical Anesthesia Plan  ASA: 3  Anesthesia Plan: General   Post-op Pain Management:    Induction:   PONV Risk Score and Plan:   Airway Management Planned:   Additional Equipment:   Intra-op Plan:   Post-operative Plan:   Informed Consent: I have reviewed the patients History and Physical, chart, labs and discussed the procedure including the risks, benefits and alternatives for the proposed anesthesia with the patient or authorized representative who has indicated his/her understanding and  acceptance.     Dental Advisory Given  Plan Discussed with: CRNA  Anesthesia Plan Comments:         Anesthesia Quick Evaluation

## 2024-07-16 NOTE — H&P (Signed)
 Pre-Procedure H&P   Patient ID: Annette Jimenez is a 63 y.o. female.  Gastroenterology Provider: [name], DO  Referring Provider: Romero [place]igua, PA PCP: Steva Clotilda DEL, NP  Date: 07/16/2024  HPI Ms. Annette Jimenez is a 63 y.o. female who presents today for Colonoscopy for colorectal cancer screening.  Last csy in 2015- normal  Sister- 2015  BM q2-3d no m/h/c/d  S/p stem cell tx   Past Medical History:  Diagnosis Date   Cancer [facility])    Multiple Myeloma   Diabetes mellitus without complication (HCC)    pre diabetic   Family history of breast cancer    Hypertension    Multiple myeloma (HCC)     Past Surgical History:  Procedure Laterality Date   left salpingectomy   1996   Ectopic    LIMBAL STEM CELL TRANSPLANT  2015    Family History Sister- crohns No other h/o GI disease or malignancy  Review of Systems  Constitutional:  Negative for activity change, appetite change, chills, diaphoresis, fatigue, fever and unexpected weight change.  HENT:  Negative for trouble swallowing and voice change.   Respiratory:  Negative for shortness of breath and wheezing.   Cardiovascular:  Negative for chest pain, palpitations and leg swelling.  Gastrointestinal:  Negative for abdominal distention, abdominal pain, anal bleeding, blood in stool, constipation, diarrhea, nausea, rectal pain and vomiting.  Musculoskeletal:  Negative for arthralgias and myalgias.  Skin:  Negative for color change and pallor.  Neurological:  Negative for dizziness, syncope and weakness.  Psychiatric/Behavioral:  Negative for confusion.   All other systems reviewed and are negative.    Medications Medications Ordered Prior to Encounter[1]  Pertinent medications related to GI and procedure were reviewed by me with the patient prior to the procedure  Current Medications[2]  sodium chloride  20 mL/hr at 07/16/24 1027       Allergies[3] Allergies were reviewed by me  prior to the procedure  Objective   Body mass index is 30.67 kg/m. Vitals:   07/16/24 1008  BP: (!) 134/94  Pulse: 90  Resp: 18  Temp: (!) 96 F (35.6 C)  TempSrc: Tympanic  SpO2: 100%  Weight: 74.8 kg  Height: 5' 1.5 (1.562 m)     Physical Exam Vitals and nursing note reviewed.  Constitutional:      General: She is not in acute distress.    Appearance: Normal appearance. She is not ill-appearing, toxic-appearing or diaphoretic.  HENT:     Head: Normocephalic and atraumatic.     Nose: Nose normal.     Mouth/Throat:     Mouth: Mucous membranes are moist.     Pharynx: Oropharynx is clear.  Eyes:     General: No scleral icterus.    Extraocular Movements: Extraocular movements intact.  Cardiovascular:     Rate and Rhythm: Normal rate and regular rhythm.     Heart sounds: Normal heart sounds. No murmur heard.    No friction rub. No gallop.  Pulmonary:     Effort: Pulmonary effort is normal. No respiratory distress.     Breath sounds: Normal breath sounds. No wheezing, rhonchi or rales.  Abdominal:     General: Bowel sounds are normal. There is no distension.     Palpations: Abdomen is soft.     Tenderness: There is no abdominal tenderness. There is no guarding or rebound.  Musculoskeletal:     Cervical back: Neck supple.     Right lower leg: No edema.  Left lower leg: No edema.  Skin:    General: Skin is warm and dry.     Coloration: Skin is not jaundiced or pale.  Neurological:     General: No focal deficit present.     Mental Status: She is alert and oriented to person, place, and time. Mental status is at baseline.  Psychiatric:        Mood and Affect: Mood normal.        Behavior: Behavior normal.        Thought Content: Thought content normal.        Judgment: Judgment normal.      Assessment:  Ms. Annette Jimenez is a 63 y.o. female  who presents today for Colonoscopy for colorectal cancer screening .  Plan:  Colonoscopy with possible  intervention today  Colonoscopy with possible biopsy, control of bleeding, polypectomy, and interventions as necessary has been discussed with the patient/patient representative. Informed consent was obtained from the patient/patient representative after explaining the indication, nature, and risks of the procedure including but not limited to death, bleeding, perforation, missed neoplasm/lesions, cardiorespiratory compromise, and reaction to medications. Opportunity for questions was given and appropriate answers were provided. Patient/patient representative has verbalized understanding is amenable to undergoing the procedure.   [name], DO  [facility]  Portions of the record may have been created with voice recognition software. Occasional wrong-word or 'sound-a-like' substitutions may have occurred due to the inherent limitations of voice recognition software.  Read the chart carefully and recognize, using context, where substitutions may have occurred.    [1]  Current Facility-Administered Medications on File Prior to Encounter  Medication Dose Route Frequency Provider Last Rate Last Admin   0.9 %  sodium chloride  infusion   Intravenous Continuous Corcoran, Melissa C, MD   Stopped at 07/15/15 1507   Current Outpatient Medications on File Prior to Encounter  Medication Sig Dispense Refill   amLODipine (NORVASC) 5 MG tablet Take by mouth.     metFORMIN (GLUCOPHAGE-XR) 500 MG 24 hr tablet Take 500 mg by mouth 2 (two) times daily.     Multiple Vitamin (MULTI-VITAMINS) TABS Take by mouth.     ONE TOUCH ULTRA TEST test strip      ONETOUCH DELICA LANCETS 33G MISC     [2]  Current Facility-Administered Medications:    0.9 %  sodium chloride  infusion, , Intravenous, Continuous, Jungling Elspeth Ozell, DO, Last Rate: 20 mL/hr at 07/16/24 1027, New Bag at 07/16/24 1027  Facility-Administered Medications Ordered in Other Encounters:    0.9 %  sodium chloride   infusion, , Intravenous, Continuous, Corcoran, Melissa C, MD, Stopped at 07/15/15 1507 [3]  Allergies Allergen Reactions   Bee Venom Swelling

## 2024-08-01 NOTE — Anesthesia Postprocedure Evaluation (Signed)
"   Anesthesia Post Note  Patient: Annette Jimenez  Procedure(s) Performed: COLONOSCOPY  Patient location during evaluation: PACU Anesthesia Type: General Level of consciousness: awake and alert Pain management: pain level controlled Vital Signs Assessment: post-procedure vital signs reviewed and stable Respiratory status: spontaneous breathing, nonlabored ventilation, respiratory function stable and patient connected to nasal cannula oxygen Cardiovascular status: blood pressure returned to baseline and stable Postop Assessment: no apparent nausea or vomiting Anesthetic complications: no   No notable events documented.   Last Vitals:  Vitals:   07/16/24 1120 07/16/24 1133  BP: 99/76 118/84  Pulse: 82   Resp: 15 (!) 9  Temp:    SpO2: 98%     Last Pain:  Vitals:   07/16/24 1116  TempSrc: Tympanic                 Lynwood KANDICE Clause      "
